# Patient Record
Sex: Male | Born: 1990 | Race: White | Hispanic: No | Marital: Married | Smoking: Never smoker
Health system: Southern US, Community
[De-identification: ages and names within clinical notes are randomized; demographics above are authoritative.]

## PROBLEM LIST (undated history)

## (undated) DIAGNOSIS — T7840XA Allergy, unspecified, initial encounter: Secondary | ICD-10-CM

## (undated) DIAGNOSIS — F988 Other specified behavioral and emotional disorders with onset usually occurring in childhood and adolescence: Secondary | ICD-10-CM

## (undated) DIAGNOSIS — J45909 Unspecified asthma, uncomplicated: Secondary | ICD-10-CM

## (undated) HISTORY — DX: Allergy, unspecified, initial encounter: T78.40XA

## (undated) HISTORY — DX: Unspecified asthma, uncomplicated: J45.909

## (undated) HISTORY — DX: Other specified behavioral and emotional disorders with onset usually occurring in childhood and adolescence: F98.8

---

## 2013-05-23 ENCOUNTER — Encounter (INDEPENDENT_AMBULATORY_CARE_PROVIDER_SITE_OTHER): Payer: Self-pay

## 2013-05-23 ENCOUNTER — Encounter: Payer: Self-pay | Admitting: Adult Health

## 2013-05-23 ENCOUNTER — Ambulatory Visit (INDEPENDENT_AMBULATORY_CARE_PROVIDER_SITE_OTHER): Payer: BC Managed Care – PPO | Admitting: Adult Health

## 2013-05-23 VITALS — BP 102/68 | HR 50 | Temp 98.4°F | Resp 12 | Ht 74.0 in | Wt 179.8 lb

## 2013-05-23 DIAGNOSIS — F988 Other specified behavioral and emotional disorders with onset usually occurring in childhood and adolescence: Secondary | ICD-10-CM

## 2013-05-23 DIAGNOSIS — M25519 Pain in unspecified shoulder: Secondary | ICD-10-CM

## 2013-05-23 DIAGNOSIS — Z0000 Encounter for general adult medical examination without abnormal findings: Secondary | ICD-10-CM

## 2013-05-23 DIAGNOSIS — M25512 Pain in left shoulder: Secondary | ICD-10-CM

## 2013-05-23 MED ORDER — AMPHETAMINE-DEXTROAMPHETAMINE 15 MG PO TABS: 15.0000 mg | ORAL_TABLET | Freq: Every day | ORAL | Status: DC

## 2013-05-23 NOTE — Progress Notes (Signed)
Pre visit review using our clinic review tool, if applicable. No additional management support is needed unless otherwise documented below in the visit note. 

## 2013-05-23 NOTE — Progress Notes (Signed)
Subjective:    Patient ID: Logan Schmidt, male    DOB: 1991/03/11, 23 y.o.   MRN: [id number]  HPI  Logan Schmidt" is a pleasant 23 y/o male who presents to clinic to establish care. Last PCP was [facility]. Will request records. He is feeling well overall. Exercises regularly. Reports that diet could be better. He has a hx of ADD and has been prescribed Adderall. Has not taken this in about 1.5 years. He is working in Audiological scientist and supply chain and feels that he has trouble focusing at times. He would like to restart the Adderall. Karleen Schmidt reports that while working out he felt a strain on his left shoulder. He has full ROM but is having some discomfort in the left shoulder. No radiation of pain. He has not taken any medication for pain.     Past Medical History  Diagnosis Date  . Asthma   . ADD (attention deficit disorder)     Adderall  . Allergy     Cat, dogs     History reviewed. No pertinent past surgical history.   Family History  Problem Relation Age of Onset  . Cancer Mother     breast cancer  . Hyperlipidemia Father   . Hypertension Father   . Asthma Sister   . Heart disease Paternal Grandfather   . Hyperlipidemia Paternal Grandfather      History   Social History  . Marital Status: Single    Spouse Name: N/A    Number of Children: 0  . Years of Education: 16   Occupational History  . Accounting and Supply Chain     [employer]   Social History Main Topics  . Smoking status: Never Smoker   . Smokeless tobacco: Never Used  . Alcohol Use: 3.0 oz/week    5 Cans of beer per week  . Drug Use: No  . Sexual Activity: Not on file   Other Topics Concern  . Not on file   Social History Narrative   Logan "Karleen Schmidt" grew up in [place], [place]. 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 He is living in [place], [place]. He enjoys playing sports and attends the gym. He enjoys spending  time with friends.     Review of Systems  Constitutional: Negative.   HENT: Negative.   Eyes: Negative.   Respiratory: Negative.   Cardiovascular: Negative.   Gastrointestinal: Negative.   Endocrine: Negative.   Genitourinary: Negative.   Musculoskeletal: Negative.   Skin: Negative.   Allergic/Immunologic: Negative.   Neurological: Negative.   Hematological: Negative.   Psychiatric/Behavioral: Negative.        Objective:   Physical Exam  Constitutional: He is oriented to person, place, and time. He appears well-developed and well-nourished. No distress.  HENT:  Head: Normocephalic and atraumatic.  Right Ear: External ear normal.  Left Ear: External ear normal.  Nose: Nose normal.  Mouth/Throat: Oropharynx is clear and moist.  Eyes: Conjunctivae and EOM are normal. Pupils are equal, round, and reactive to light.  Neck: Normal range of motion. Neck supple. No tracheal deviation present. No thyromegaly present.  Cardiovascular: Normal rate, regular rhythm, normal heart sounds and intact distal pulses.  Exam reveals no gallop and no friction rub.   No murmur heard. Pulmonary/Chest: Effort normal and breath sounds normal. No respiratory distress. He has no wheezes. He has no rales.  Abdominal: Soft. Bowel sounds are normal. He exhibits no distension  and no mass. There is no tenderness. There is no rebound and no guarding.  Musculoskeletal: Normal range of motion. He exhibits no edema and no tenderness.  Lymphadenopathy:    He has no cervical adenopathy.  Neurological: He is alert and oriented to person, place, and time. He has normal reflexes. No cranial nerve deficit. Coordination normal.  Skin: Skin is warm and dry.  Psychiatric: He has a normal mood and affect. His behavior is normal. Judgment and thought content normal.       Assessment & Plan:   1. Routine general medical examination at a health care facility Normal physical exam. Last physical exam at Pediatrics  office in [place]. Will request records. Check routine labs. - CBC with Differential; Future - Lipid panel; Future - Basic metabolic panel; Future  2. ADD (attention deficit disorder) Start Adderall 15 mg daily. Prescriptions for 3 months provided.  3. Left shoulder pain Conservative tx with NSAIDs, ice, rest. If no improvement will refer to sports medicine.

## 2015-03-02 ENCOUNTER — Encounter: Payer: Self-pay | Admitting: Nurse Practitioner

## 2015-03-02 ENCOUNTER — Ambulatory Visit (INDEPENDENT_AMBULATORY_CARE_PROVIDER_SITE_OTHER): Payer: BLUE CROSS/BLUE SHIELD | Admitting: Nurse Practitioner

## 2015-03-02 VITALS — BP 112/68 | HR 47 | Temp 98.9°F | Ht 74.0 in | Wt 189.0 lb

## 2015-03-02 DIAGNOSIS — J4520 Mild intermittent asthma, uncomplicated: Secondary | ICD-10-CM | POA: Diagnosis not present

## 2015-03-02 DIAGNOSIS — J45909 Unspecified asthma, uncomplicated: Secondary | ICD-10-CM | POA: Insufficient documentation

## 2015-03-02 DIAGNOSIS — F909 Attention-deficit hyperactivity disorder, unspecified type: Secondary | ICD-10-CM

## 2015-03-02 DIAGNOSIS — F988 Other specified behavioral and emotional disorders with onset usually occurring in childhood and adolescence: Secondary | ICD-10-CM | POA: Insufficient documentation

## 2015-03-02 MED ORDER — AMPHETAMINE-DEXTROAMPHET ER 10 MG PO CP24: 10.0000 mg | ORAL_CAPSULE | Freq: Every day | ORAL | Status: DC

## 2015-03-02 MED ORDER — ALBUTEROL SULFATE HFA 108 (90 BASE) MCG/ACT IN AERS: 1.0000 | INHALATION_SPRAY | Freq: Four times a day (QID) | RESPIRATORY_TRACT | Status: DC | PRN

## 2015-03-02 NOTE — Progress Notes (Signed)
Patient ID: Logan Schmidt, male    DOB: 05/23/1990  Age: 24 y.o. MRN: [id number]  CC: Medication Refill   HPI Logan RiffleMichael Schmidt presents for CC of medication refill.   1) Declined flu shot   Mole under right arm, bumped up and mom concerned  We'll has not grown any in area, but has elevated slightly. Denies color change, discharge, bleeding.  2) Was on Adderall  5th/6th grade was diagnosed  Has it intermittently  Used as needed basis  8 months prior use Used Adderall XR in college   History Logan Schmidt has a past medical history of Asthma; ADD (attention deficit disorder); and Allergy.   He has no past surgical history on file.   His family history includes Asthma in his sister; Cancer in his mother; Heart disease in his paternal grandfather; Hyperlipidemia in his father and paternal grandfather; Hypertension in his father.He reports that he has never smoked. He has never used smokeless tobacco. He reports that he drinks about 3.0 oz of alcohol per week. He reports that he does not use illicit drugs.  Outpatient Prescriptions Prior to Visit  Medication Sig Dispense Refill  . cetirizine (ZYRTEC) 10 MG tablet Take 10 mg by mouth daily.    Marland Kitchen. albuterol (PROVENTIL HFA;VENTOLIN HFA) 108 (90 BASE) MCG/ACT inhaler Inhale 1-2 puffs into the lungs every 6 (six) hours as needed for wheezing or shortness of breath.    . amphetamine-dextroamphetamine (ADDERALL) 15 MG tablet Take 1 tablet (15 mg total) by mouth daily. 30 tablet 0   No facility-administered medications prior to visit.    ROS Review of Systems  Constitutional: Negative for fever, chills, diaphoresis and fatigue.  HENT: Negative for tinnitus and trouble swallowing.   Respiratory: Negative for chest tightness, shortness of breath and wheezing.   Cardiovascular: Negative for chest pain, palpitations and leg swelling.  Gastrointestinal: Negative for nausea, vomiting and diarrhea.  Skin: Positive for color change. Negative for rash.   Neurological: Negative for dizziness, weakness and numbness.  Psychiatric/Behavioral: Positive for confusion. The patient is not nervous/anxious.     Objective:  BP 112/68 mmHg  Pulse 47  Temp(Src) 98.9 F (37.2 C)  Ht 6\' 2"  (1.88 m)  Wt 189 lb (85.73 kg)  BMI 24.26 kg/m2  SpO2 98%  Physical Exam  Constitutional: He is oriented to person, place, and time. He appears well-developed and well-nourished. No distress.  HENT:  Head: Normocephalic and atraumatic.  Right Ear: External ear normal.  Left Ear: External ear normal.  Cardiovascular: Normal rate, regular rhythm and normal heart sounds.  Exam reveals no gallop and no friction rub.   No murmur heard. Pulmonary/Chest: Effort normal and breath sounds normal. No respiratory distress. He has no wheezes. He has no rales. He exhibits no tenderness.  Neurological: He is alert and oriented to person, place, and time.  Skin: Skin is warm and dry. No rash noted. He is not diaphoretic.  Small 2 mm light brown papule No asymmetry, border irregularity, color differences within the nevus  Psychiatric: He has a normal mood and affect. His behavior is normal. Judgment and thought content normal.   Assessment & Plan:   Logan Schmidt was seen today for medication refill.  Diagnoses and all orders for this visit:  Asthma, mild intermittent, uncomplicated  ADD (attention deficit disorder)  Other orders -     albuterol (PROVENTIL HFA;VENTOLIN HFA) 108 (90 BASE) MCG/ACT inhaler; Inhale 1-2 puffs into the lungs every 6 (six) hours as needed for wheezing or shortness of  breath. -     amphetamine-dextroamphetamine (ADDERALL XR) 10 MG 24 hr capsule; Take 1 capsule (10 mg total) by mouth daily.   I have discontinued Logan Schmidt amphetamine-dextroamphetamine. I am also having him start on amphetamine-dextroamphetamine. Additionally, I am having him maintain his cetirizine and albuterol.  Meds ordered this encounter  Medications  . albuterol  (PROVENTIL HFA;VENTOLIN HFA) 108 (90 BASE) MCG/ACT inhaler    Sig: Inhale 1-2 puffs into the lungs every 6 (six) hours as needed for wheezing or shortness of breath.    Dispense:  1 Inhaler    Refill:  2    Order Specific Question:  Supervising Provider    Answer:  Darrick Huntsman, TERESA L [[id number]]  . amphetamine-dextroamphetamine (ADDERALL XR) 10 MG 24 hr capsule    Sig: Take 1 capsule (10 mg total) by mouth daily.    Dispense:  14 capsule    Refill:  0    Order Specific Question:  Supervising Provider    Answer:  Sherlene Shams [[id number]]     Follow-up: Return in about 2 weeks (around 03/16/2015) for Medication follow up.

## 2015-03-02 NOTE — Assessment & Plan Note (Addendum)
No recent concerns. Lungs sounds good today. Albuterol inhaler refilled due to expiration of last inhaler

## 2015-03-02 NOTE — Patient Instructions (Signed)
Nice to meet you!   Follow up in 2 weeks about your ADD- we will see if you need a medication change.

## 2015-03-02 NOTE — Assessment & Plan Note (Addendum)
Diagnosed as a 5th/6th grader approximately. Pt has been intermittently on medication to help with inattentiveness. He has a diagnosis, but is unsure of where this paperwork is since his parent's recently moved. He was given a medical release form to get records to us. CSC signed today. UDS will obtain at next visit. Willing to try Adderall XR 10 mg daily. Gave 2 weeks worth. Will FU in 2 weeks.

## 2015-03-18 ENCOUNTER — Ambulatory Visit: Payer: BLUE CROSS/BLUE SHIELD | Admitting: Nurse Practitioner

## 2015-03-24 ENCOUNTER — Encounter: Payer: Self-pay | Admitting: Nurse Practitioner

## 2015-03-24 ENCOUNTER — Ambulatory Visit: Payer: BLUE CROSS/BLUE SHIELD | Admitting: Nurse Practitioner

## 2015-03-24 ENCOUNTER — Ambulatory Visit (INDEPENDENT_AMBULATORY_CARE_PROVIDER_SITE_OTHER): Payer: BLUE CROSS/BLUE SHIELD | Admitting: Nurse Practitioner

## 2015-03-24 VITALS — BP 116/72 | HR 57 | Temp 98.2°F | Resp 14 | Ht 74.0 in | Wt 191.2 lb

## 2015-03-24 DIAGNOSIS — I781 Nevus, non-neoplastic: Secondary | ICD-10-CM

## 2015-03-24 DIAGNOSIS — F988 Other specified behavioral and emotional disorders with onset usually occurring in childhood and adolescence: Secondary | ICD-10-CM

## 2015-03-24 DIAGNOSIS — F909 Attention-deficit hyperactivity disorder, unspecified type: Secondary | ICD-10-CM

## 2015-03-24 MED ORDER — AMPHETAMINE-DEXTROAMPHET ER 10 MG PO CP24: 10.0000 mg | ORAL_CAPSULE | Freq: Every day | ORAL | Status: DC

## 2015-03-24 NOTE — Progress Notes (Signed)
Patient ID: Logan Schmidt, male    DOB: 07/26/1990  Age: 25 y.o. MRN: [id number]  CC: Medication Refill and Nevus   HPI Logan Schmidt presents for follow up of ADHD medication and nevus.   1) Pt reports he showed me the wrong nevus at the last visit. The one he is concerned about is under his left arm.  2 mm, light brown- fleck of dark brown in the center and raised  No changes recently, denies bleeding, discharge, or growth   2) Lowered appetite slightly, staying on task and feels he wants to stay on the same dosage.   History Logan Schmidt has a past medical history of Asthma; ADD (attention deficit disorder); and Allergy.   He has no past surgical history on file.   His family history includes Asthma in his sister; Cancer in his mother; Heart disease in his paternal grandfather; Hyperlipidemia in his father and paternal grandfather; Hypertension in his father.He reports that he has never smoked. He has never used smokeless tobacco. He reports that he drinks about 3.0 oz of alcohol per week. He reports that he does not use illicit drugs.  Outpatient Prescriptions Prior to Visit  Medication Sig Dispense Refill  . albuterol (PROVENTIL HFA;VENTOLIN HFA) 108 (90 BASE) MCG/ACT inhaler Inhale 1-2 puffs into the lungs every 6 (six) hours as needed for wheezing or shortness of breath. 1 Inhaler 2  . cetirizine (ZYRTEC) 10 MG tablet Take 10 mg by mouth daily.    Marland Kitchen. amphetamine-dextroamphetamine (ADDERALL XR) 10 MG 24 hr capsule Take 1 capsule (10 mg total) by mouth daily. 14 capsule 0   No facility-administered medications prior to visit.    ROS Review of Systems  Constitutional: Negative for fever, chills, diaphoresis and fatigue.  Cardiovascular: Negative for chest pain, palpitations and leg swelling.  Skin: Negative for color change, pallor, rash and wound.  Psychiatric/Behavioral: Positive for decreased concentration. Negative for suicidal ideas and sleep disturbance. The patient is not  nervous/anxious and is not hyperactive.     Objective:  BP 116/72 mmHg  Pulse 57  Temp(Src) 98.2 F (36.8 C) (Oral)  Resp 14  Ht 6\' 2"  (1.88 m)  Wt 191 lb 3.2 oz (86.728 kg)  BMI 24.54 kg/m2  SpO2 97%  Physical Exam  Constitutional: He is oriented to person, place, and time. He appears well-developed and well-nourished. No distress.  HENT:  Head: Normocephalic and atraumatic.  Right Ear: External ear normal.  Left Ear: External ear normal.  Cardiovascular: Normal rate, regular rhythm and normal heart sounds.  Exam reveals no gallop and no friction rub.   No murmur heard. Athletic   Pulmonary/Chest: Effort normal and breath sounds normal. No respiratory distress. He has no wheezes. He has no rales. He exhibits no tenderness.  Neurological: He is alert and oriented to person, place, and time.  Skin: Skin is warm and dry. No rash noted. He is not diaphoretic.  2 mm raised, symmetric, non-irregular border, light brown with 1 fleck of dark brown in center, no sign of irritation Left axilla  Psychiatric: He has a normal mood and affect. His behavior is normal. Judgment and thought content normal.   Assessment & Plan:   Logan Schmidt was seen today for medication refill and nevus.  Diagnoses and all orders for this visit:  Nevus, non-neoplastic  ADD (attention deficit disorder)  Other orders -     Discontinue: amphetamine-dextroamphetamine (ADDERALL XR) 10 MG 24 hr capsule; Take 1 capsule (10 mg total) by mouth daily. -  Discontinue: amphetamine-dextroamphetamine (ADDERALL XR) 10 MG 24 hr capsule; Take 1 capsule (10 mg total) by mouth daily. -     amphetamine-dextroamphetamine (ADDERALL XR) 10 MG 24 hr capsule; Take 1 capsule (10 mg total) by mouth daily.  I am having Mr. Druck maintain his cetirizine, albuterol, and amphetamine-dextroamphetamine.  Meds ordered this encounter  Medications  . DISCONTD: amphetamine-dextroamphetamine (ADDERALL XR) 10 MG 24 hr capsule    Sig: Take  1 capsule (10 mg total) by mouth daily.    Dispense:  30 capsule    Refill:  0    Fill on or after Jan. 10th, 2017    Order Specific Question:  Supervising Provider    Answer:  Duncan Dull L [[id number]]  . DISCONTD: amphetamine-dextroamphetamine (ADDERALL XR) 10 MG 24 hr capsule    Sig: Take 1 capsule (10 mg total) by mouth daily.    Dispense:  30 capsule    Refill:  0    Fill on or after Feb. 10th, 2017    Order Specific Question:  Supervising Provider    Answer:  Duncan Dull L [[id number]]  . amphetamine-dextroamphetamine (ADDERALL XR) 10 MG 24 hr capsule    Sig: Take 1 capsule (10 mg total) by mouth daily.    Dispense:  30 capsule    Refill:  0    Fill on or after March 10th, 2017    Order Specific Question:  Supervising Provider    Answer:  Sherlene Shams [[id number]]     Follow-up: Return in about 3 months (around 06/22/2015) for Follow up for ADHD.

## 2015-03-24 NOTE — Patient Instructions (Signed)
Please follow up in 3 months.  

## 2015-03-29 DIAGNOSIS — I781 Nevus, non-neoplastic: Secondary | ICD-10-CM | POA: Insufficient documentation

## 2015-03-29 NOTE — Assessment & Plan Note (Signed)
New onset Stable Will follow  Asked him to let me know if anything changes

## 2015-03-29 NOTE — Assessment & Plan Note (Signed)
Records received and sent to scan  Established problem stable CSC signed last visit- no UDS today due to inability to urinate- low suspicion for deviance. [place] checked for compliance  3 months of scripts printed, signed, and given to pt to take to pharmacy. FU in 3 months

## 2015-06-25 ENCOUNTER — Ambulatory Visit: Payer: BLUE CROSS/BLUE SHIELD | Admitting: Nurse Practitioner

## 2015-10-21 ENCOUNTER — Encounter: Payer: Self-pay | Admitting: Family

## 2015-10-21 ENCOUNTER — Ambulatory Visit (INDEPENDENT_AMBULATORY_CARE_PROVIDER_SITE_OTHER): Payer: BLUE CROSS/BLUE SHIELD | Admitting: Family

## 2015-10-21 ENCOUNTER — Encounter (INDEPENDENT_AMBULATORY_CARE_PROVIDER_SITE_OTHER): Payer: Self-pay

## 2015-10-21 VITALS — BP 114/68 | HR 47 | Temp 98.0°F | Ht 74.0 in | Wt 192.0 lb

## 2015-10-21 DIAGNOSIS — Z0000 Encounter for general adult medical examination without abnormal findings: Secondary | ICD-10-CM | POA: Diagnosis not present

## 2015-10-21 DIAGNOSIS — J4520 Mild intermittent asthma, uncomplicated: Secondary | ICD-10-CM

## 2015-10-21 DIAGNOSIS — R6889 Other general symptoms and signs: Secondary | ICD-10-CM

## 2015-10-21 DIAGNOSIS — F909 Attention-deficit hyperactivity disorder, unspecified type: Secondary | ICD-10-CM | POA: Diagnosis not present

## 2015-10-21 DIAGNOSIS — Z0001 Encounter for general adult medical examination with abnormal findings: Secondary | ICD-10-CM | POA: Diagnosis not present

## 2015-10-21 DIAGNOSIS — F988 Other specified behavioral and emotional disorders with onset usually occurring in childhood and adolescence: Secondary | ICD-10-CM

## 2015-10-21 LAB — VITAMIN D 25 HYDROXY (VIT D DEFICIENCY, FRACTURES): VITD: 15.69 ng/mL — ABNORMAL LOW (ref 30.00–100.00)

## 2015-10-21 LAB — CBC WITH DIFFERENTIAL/PLATELET
BASOS PCT: 0.5 % (ref 0.0–3.0)
Basophils Absolute: 0 10*3/uL (ref 0.0–0.1)
EOS ABS: 0.4 10*3/uL (ref 0.0–0.7)
Eosinophils Relative: 8.6 % — ABNORMAL HIGH (ref 0.0–5.0)
HCT: 42.5 % (ref 39.0–52.0)
Hemoglobin: 14.4 g/dL (ref 13.0–17.0)
Lymphocytes Relative: 31.2 % (ref 12.0–46.0)
Lymphs Abs: 1.6 10*3/uL (ref 0.7–4.0)
MCHC: 33.7 g/dL (ref 30.0–36.0)
MCV: 89.5 fl (ref 78.0–100.0)
MONO ABS: 0.4 10*3/uL (ref 0.1–1.0)
Monocytes Relative: 7.5 % (ref 3.0–12.0)
NEUTROS ABS: 2.7 10*3/uL (ref 1.4–7.7)
Neutrophils Relative %: 52.2 % (ref 43.0–77.0)
PLATELETS: 133 10*3/uL — AB (ref 150.0–400.0)
RBC: 4.76 Mil/uL (ref 4.22–5.81)
RDW: 13.5 % (ref 11.5–15.5)
WBC: 5.2 10*3/uL (ref 4.0–10.5)

## 2015-10-21 LAB — LIPID PANEL
CHOLESTEROL: 119 mg/dL (ref 0–200)
HDL: 56.8 mg/dL (ref >39.00)
LDL CALC: 51 mg/dL (ref 0–99)
NonHDL: 62.48
Total CHOL/HDL Ratio: 2
Triglycerides: 58 mg/dL (ref 0.0–149.0)
VLDL: 11.6 mg/dL (ref 0.0–40.0)

## 2015-10-21 LAB — COMPREHENSIVE METABOLIC PANEL
ALT: 14 U/L (ref 0–53)
AST: 23 U/L (ref 0–37)
Albumin: 4.4 g/dL (ref 3.5–5.2)
Alkaline Phosphatase: 56 U/L (ref 39–117)
BUN: 18 mg/dL (ref 6–23)
CALCIUM: 9.3 mg/dL (ref 8.4–10.5)
CHLORIDE: 104 meq/L (ref 96–112)
CO2: 31 meq/L (ref 19–32)
CREATININE: 1.01 mg/dL (ref 0.40–1.50)
GFR: 94.41 mL/min (ref >60.00)
Glucose, Bld: 87 mg/dL (ref 70–99)
POTASSIUM: 3.8 meq/L (ref 3.5–5.1)
SODIUM: 138 meq/L (ref 135–145)
Total Bilirubin: 0.7 mg/dL (ref 0.2–1.2)
Total Protein: 8 g/dL (ref 6.0–8.3)

## 2015-10-21 LAB — TSH: TSH: 1.32 u[IU]/mL (ref 0.35–4.50)

## 2015-10-21 LAB — HEMOGLOBIN A1C: Hgb A1c MFr Bld: 5.4 % (ref 4.6–6.5)

## 2015-10-21 MED ORDER — ALBUTEROL SULFATE HFA 108 (90 BASE) MCG/ACT IN AERS: 1.0000 | INHALATION_SPRAY | Freq: Four times a day (QID) | RESPIRATORY_TRACT | 2 refills | Status: DC | PRN

## 2015-10-21 MED ORDER — AMPHETAMINE-DEXTROAMPHET ER 10 MG PO CP24: 10.0000 mg | ORAL_CAPSULE | Freq: Every day | ORAL | 0 refills | Status: DC

## 2015-10-21 NOTE — Patient Instructions (Addendum)
Pleasure meeting you.  Health Maintenance, Male A healthy lifestyle and preventative care can promote health and wellness.  Maintain regular health, dental, and eye exams.  Eat a healthy diet. Foods like vegetables, fruits, whole grains, low-fat dairy products, and lean protein foods contain the nutrients you need and are low in calories. Decrease your intake of foods high in solid fats, added sugars, and salt. Get information about a proper diet from your health care provider, if necessary.  Regular physical exercise is one of the most important things you can do for your health. Most adults should get at least 150 minutes of moderate-intensity exercise (any activity that increases your heart rate and causes you to sweat) each week. In addition, most adults need muscle-strengthening exercises on 2 or more days a week.   Maintain a healthy weight. The body mass index (BMI) is a screening tool to identify possible weight problems. It provides an estimate of body fat based on height and weight. Your health care provider can find your BMI and can help you achieve or maintain a healthy weight. For males 20 years and older:  A BMI below 18.5 is considered underweight.  A BMI of 18.5 to 24.9 is normal.  A BMI of 25 to 29.9 is considered overweight.  A BMI of 30 and above is considered obese.  Maintain normal blood lipids and cholesterol by exercising and minimizing your intake of saturated fat. Eat a balanced diet with plenty of fruits and vegetables. Blood tests for lipids and cholesterol should begin at age 75 and be repeated every 5 years. If your lipid or cholesterol levels are high, you are over age 29, or you are at high risk for heart disease, you may need your cholesterol levels checked more frequently.Ongoing high lipid and cholesterol levels should be treated with medicines if diet and exercise are not working.  If you smoke, find out from your health care provider how to quit. If you do  not use tobacco, do not start.  Lung cancer screening is recommended for adults aged 4-80 years who are at high risk for developing lung cancer because of a history of smoking. A yearly low-dose CT scan of the lungs is recommended for people who have at least a 30-pack-year history of smoking and are current smokers or have quit within the past 15 years. A pack year of smoking is smoking an average of 1 pack of cigarettes a day for 1 year (for example, a 30-pack-year history of smoking could mean smoking 1 pack a day for 30 years or 2 packs a day for 15 years). Yearly screening should continue until the smoker has stopped smoking for at least 15 years. Yearly screening should be stopped for people who develop a health problem that would prevent them from having lung cancer treatment.  If you choose to drink alcohol, do not have more than 2 drinks per day. One drink is considered to be 12 oz (360 mL) of beer, 5 oz (150 mL) of wine, or 1.5 oz (45 mL) of liquor.  Avoid the use of street drugs. Do not share needles with anyone. Ask for help if you need support or instructions about stopping the use of drugs.  High blood pressure causes heart disease and increases the risk of stroke. High blood pressure is more likely to develop in:  People who have blood pressure in the end of the normal range (100-139/85-89 mm Hg).  People who are overweight or obese.  People who  are African American.  If you are 6718-27 years of age, have your blood pressure checked every 3-5 years. If you are 27 years of age or older, have your blood pressure checked every year. You should have your blood pressure measured twice--once when you are at a hospital or clinic, and once when you are not at a hospital or clinic. Record the average of the two measurements. To check your blood pressure when you are not at a hospital or clinic, you can use:  An automated blood pressure machine at a pharmacy.  A home blood pressure  monitor.  If you are 145-27 years old, ask your health care provider if you should take aspirin to prevent heart disease.  Diabetes screening involves taking a blood sample to check your fasting blood sugar level. This should be done once every 3 years after age 27 if you are at a normal weight and without risk factors for diabetes. Testing should be considered at a younger age or be carried out more frequently if you are overweight and have at least 1 risk factor for diabetes.  Colorectal cancer can be detected and often prevented. Most routine colorectal cancer screening begins at the age of 27 and continues through age 27. However, your health care provider may recommend screening at an earlier age if you have risk factors for colon cancer. On a yearly basis, your health care provider may provide home test kits to check for hidden blood in the stool. A small camera at the end of a tube may be used to directly examine the colon (sigmoidoscopy or colonoscopy) to detect the earliest forms of colorectal cancer. Talk to your health care provider about this at age 27 when routine screening begins. A direct exam of the colon should be repeated every 5-10 years through age 27, unless early forms of precancerous polyps or small growths are found.  People who are at an increased risk for hepatitis B should be screened for this virus. You are considered at high risk for hepatitis B if:  You were born in a country where hepatitis B occurs often. Talk with your health care provider about which countries are considered high risk.  Your parents were born in a high-risk country and you have not received a shot to protect against hepatitis B (hepatitis B vaccine).  You have HIV or AIDS.  You use needles to inject street drugs.  You live with, or have sex with, someone who has hepatitis B.  You are a man who has sex with other men (MSM).  You get hemodialysis treatment.  You take certain medicines for  conditions like cancer, organ transplantation, and autoimmune conditions.  Hepatitis C blood testing is recommended for all people born from [id number] through 1965 and any individual with known risk factors for hepatitis C.  Healthy men should no longer receive prostate-specific antigen (PSA) blood tests as part of routine cancer screening. Talk to your health care provider about prostate cancer screening.  Testicular cancer screening is not recommended for adolescents or adult males who have no symptoms. Screening includes self-exam, a health care provider exam, and other screening tests. Consult with your health care provider about any symptoms you have or any concerns you have about testicular cancer.  Practice safe sex. Use condoms and avoid high-risk sexual practices to reduce the spread of sexually transmitted infections (STIs).  You should be screened for STIs, including gonorrhea and chlamydia if:  You are sexually active and are younger than  24 years.  You are older than 24 years, and your health care provider tells you that you are at risk for this type of infection.  Your sexual activity has changed since you were last screened, and you are at an increased risk for chlamydia or gonorrhea. Ask your health care provider if you are at risk.  If you are at risk of being infected with HIV, it is recommended that you take a prescription medicine daily to prevent HIV infection. This is called pre-exposure prophylaxis (PrEP). You are considered at risk if:  You are a man who has sex with other men (MSM).  You are a heterosexual man who is sexually active with multiple partners.  You take drugs by injection.  You are sexually active with a partner who has HIV.  Talk with your health care provider about whether you are at high risk of being infected with HIV. If you choose to begin PrEP, you should first be tested for HIV. You should then be tested every 3 months for as long as you are taking  PrEP.  Use sunscreen. Apply sunscreen liberally and repeatedly throughout the day. You should seek shade when your shadow is shorter than you. Protect yourself by wearing long sleeves, pants, a wide-brimmed hat, and sunglasses year round whenever you are outdoors.  Tell your health care provider of new moles or changes in moles, especially if there is a change in shape or color. Also, tell your health care provider if a mole is larger than the size of a pencil eraser.  A one-time screening for abdominal aortic aneurysm (AAA) and surgical repair of large AAAs by ultrasound is recommended for men aged 65-75 years who are current or former smokers.  Stay current with your vaccines (immunizations).   This information is not intended to replace advice given to you by your health care provider. Make sure you discuss any questions you have with your health care provider.   Document Released: 08/27/2007 Document Revised: 03/21/2014 Document Reviewed: 07/26/2010 Elsevier Interactive Patient Education [employer]

## 2015-10-21 NOTE — Progress Notes (Signed)
Subjective:    Patient ID: Logan RiffleMichael Steckman, male    DOB: 04/18/1990, 25 y.o.   MRN: [id number]  CC: Logan RiffleMichael Thoma is a 25 y.o. male who presents today for follow up.   HPI: Patient presents to follow up, for his ADD and refill medication. New CSC to be signed today.   ADD- Diagnosed with formal testing as a child; started Adderall as child however didn't use medication until college at [employer]. Has found that quality of work improves on Adderall. No side effects on medication - sleeping and eating well. No palpitations.  Asthma - well controlled; no recent episodes however inhaler expired.        HISTORY:  Past Medical History:  Diagnosis Date  . ADD (attention deficit disorder)    Adderall  . Allergy    Cat, dogs  . Asthma    History reviewed. No pertinent surgical history. Family History  Problem Relation Age of Onset  . Cancer Mother     breast cancer  . Hyperlipidemia Father   . Hypertension Father   . Asthma Sister   . Heart disease Paternal Grandfather   . Hyperlipidemia Paternal Grandfather     Allergies: Penicillins Current Outpatient Prescriptions on File Prior to Visit  Medication Sig Dispense Refill  . albuterol (PROVENTIL HFA;VENTOLIN HFA) 108 (90 BASE) MCG/ACT inhaler Inhale 1-2 puffs into the lungs every 6 (six) hours as needed for wheezing or shortness of breath. 1 Inhaler 2   No current facility-administered medications on file prior to visit.     Social History  Substance Use Topics  . Smoking status: Never Smoker  . Smokeless tobacco: Never Used  . Alcohol use 6.0 oz/week    5 Cans of beer, 5 Shots of liquor per week    Review of Systems  Constitutional: Negative for chills and fever.  HENT: Negative for congestion.   Respiratory: Negative for cough.   Cardiovascular: Negative for chest pain, palpitations and leg swelling.  Gastrointestinal: Negative for diarrhea, nausea and vomiting.  Musculoskeletal: Negative for myalgias.  Skin: Negative  for rash.  Neurological: Negative for headaches.  Hematological: Negative for adenopathy.  Psychiatric/Behavioral: Negative for confusion.      Objective:    BP 114/68 (BP Location: Left Arm, Patient Position: Sitting, Cuff Size: Large)   Pulse (!) 47   Temp 98 F (36.7 C) (Oral)   Ht 6\' 2"  (1.88 m)   Wt 192 lb (87.1 kg)   SpO2 98%   BMI 24.65 kg/m  BP Readings from Last 3 Encounters:  10/21/15 114/68  03/24/15 116/72  03/02/15 112/68   Wt Readings from Last 3 Encounters:  10/21/15 192 lb (87.1 kg)  03/24/15 191 lb 3.2 oz (86.7 kg)  03/02/15 189 lb (85.7 kg)    Physical Exam  Constitutional: He appears well-developed and well-nourished.  Cardiovascular: Regular rhythm and normal heart sounds.   Pulmonary/Chest: Effort normal and breath sounds normal. No respiratory distress. He has no wheezes. He has no rhonchi. He has no rales.  Neurological: He is alert.  Skin: Skin is warm and dry.  Psychiatric: He has a normal mood and affect. His speech is normal and behavior is normal.  Vitals reviewed.      Assessment & Plan:   Problem List Items Addressed This Visit      Respiratory   Asthma    Stable. Inhaler refilled.         Other   ADD (attention deficit disorder)    Stable. Well-controlled  on current regimen. Reviewed chart and it appears patients requested medical records from [facility] pediatrics on ADD however we never received these. Requested again. Patient states he was formally evaluated as a child. Assessed [place] Controlled Substance Reporting System and did not see suspicious activity at this time under patient's name and address in Epic. New CSC signed. Printed 3 month supply for patient.        Relevant Medications   amphetamine-dextroamphetamine (ADDERALL XR) 10 MG 24 hr capsule   Encounter for routine adult physical exam with abnormal findings - Primary    Patient politely declined testicular exam and STD testing, HIV  today. Up-to-date on  immunizations. We'll do screening labs today. Encouraged safe sex, exercise.        Relevant Medications   amphetamine-dextroamphetamine (ADDERALL XR) 10 MG 24 hr capsule    Other Visit Diagnoses   None.      I have discontinued Mr. Hartnett cetirizine. I am also having him maintain his albuterol, amphetamine-dextroamphetamine, amphetamine-dextroamphetamine, and amphetamine-dextroamphetamine.   Meds ordered this encounter  Medications  . amphetamine-dextroamphetamine (ADDERALL XR) 10 MG 24 hr capsule    Sig: Take 1 capsule (10 mg total) by mouth daily.    Dispense:  30 capsule    Refill:  0    Order Specific Question:   Supervising Provider    Answer:   Duncan Dull L [[id number]]  . amphetamine-dextroamphetamine (ADDERALL XR) 10 MG 24 hr capsule    Sig: Take 1 capsule (10 mg total) by mouth daily.    Dispense:  30 capsule    Refill:  0    Fill on or after 11/21/2015.    Order Specific Question:   Supervising Provider    Answer:   Sherlene Shams [[id number]]  . amphetamine-dextroamphetamine (ADDERALL XR) 10 MG 24 hr capsule    Sig: Take 1 capsule (10 mg total) by mouth daily.    Dispense:  30 capsule    Refill:  0    Fill on or after 12/21/2015    Order Specific Question:   Supervising Provider    Answer:   Sherlene Shams [[id number]]    Return precautions given.   Risks, benefits, and alternatives of the medications and treatment plan prescribed today were discussed, and patient expressed understanding.   Education regarding symptom management and diagnosis given to patient on AVS.  Continue to follow with Rennie Plowman, FNP for routine health maintenance.   Logan Schmidt and I agreed with plan.   Rennie Plowman, FNP  Total of 25 minutes spent with patient, greater than 50% of which was spent in discussion of  ADD, CSC.

## 2015-10-21 NOTE — Assessment & Plan Note (Addendum)
Stable. Well-controlled on current regimen. Reviewed chart and it appears patients [facility] however we never received these. Requested again. Patient states he was formally evaluated as a child. Assessed [place] Controlled Substance Reporting System and did not see suspicious activity at this time under patient's name and address in Epic. New CSC signed. Printed 3 month supply for patient.

## 2015-10-21 NOTE — Progress Notes (Signed)
Pre visit review using our clinic review tool, if applicable. No additional management support is needed unless otherwise documented below in the visit note. 

## 2015-10-21 NOTE — Assessment & Plan Note (Signed)
Stable. Inhaler refilled.

## 2015-10-21 NOTE — Assessment & Plan Note (Addendum)
Patient politely declined testicular exam and STD testing, HIV  today. Up-to-date on immunizations. We'll do screening labs today. Encouraged safe sex, exercise.

## 2015-10-22 ENCOUNTER — Encounter: Payer: Self-pay | Admitting: Family

## 2015-10-22 DIAGNOSIS — E559 Vitamin D deficiency, unspecified: Secondary | ICD-10-CM | POA: Insufficient documentation

## 2015-12-14 ENCOUNTER — Encounter (INDEPENDENT_AMBULATORY_CARE_PROVIDER_SITE_OTHER): Payer: Self-pay

## 2015-12-14 ENCOUNTER — Encounter: Payer: Self-pay | Admitting: Family

## 2015-12-14 ENCOUNTER — Ambulatory Visit (INDEPENDENT_AMBULATORY_CARE_PROVIDER_SITE_OTHER): Payer: BLUE CROSS/BLUE SHIELD | Admitting: Family

## 2015-12-14 DIAGNOSIS — M25512 Pain in left shoulder: Secondary | ICD-10-CM | POA: Diagnosis not present

## 2015-12-14 DIAGNOSIS — M25511 Pain in right shoulder: Secondary | ICD-10-CM | POA: Insufficient documentation

## 2015-12-14 NOTE — Patient Instructions (Signed)
Rest Alternating heat and ice Ibuprofen or capsaicin gel over the counter Gentle stretching  If there is no improvement in your symptoms, or if there is any worsening of symptoms, or if you have any additional concerns, please return for re-evaluation; or, if we are closed, consider going to the Emergency Room for evaluation if symptoms urgent.   Impingement Syndrome, Rotator Cuff, Bursitis With Rehab Impingement syndrome is a condition that involves inflammation of the tendons of the rotator cuff and the subacromial bursa, that causes pain in the shoulder. The rotator cuff consists of four tendons and muscles that control much of the shoulder and upper arm function. The subacromial bursa is a fluid filled sac that helps reduce friction between the rotator cuff and one of the bones of the shoulder (acromion). Impingement syndrome is usually an overuse injury that causes swelling of the bursa (bursitis), swelling of the tendon (tendonitis), and/or a tear of the tendon (strain). Strains are classified into three categories. Grade 1 strains cause pain, but the tendon is not lengthened. Grade 2 strains include a lengthened ligament, due to the ligament being stretched or partially ruptured. With grade 2 strains there is still function, although the function may be decreased. Grade 3 strains include a complete tear of the tendon or muscle, and function is usually impaired. SYMPTOMS   Pain around the shoulder, often at the outer portion of the upper arm.  Pain that gets worse with shoulder function, especially when reaching overhead or lifting.  Sometimes, aching when not using the arm.  Pain that wakes you up at night.  Sometimes, tenderness, swelling, warmth, or redness over the affected area.  Loss of strength.  Limited motion of the shoulder, especially reaching behind the back (to the back pocket or to unhook bra) or across your body.  Crackling sound (crepitation) when moving the  arm.  Biceps tendon pain and inflammation (in the front of the shoulder). Worse when bending the elbow or lifting. CAUSES  Impingement syndrome is often an overuse injury, in which chronic (repetitive) motions cause the tendons or bursa to become inflamed. A strain occurs when a force is paced on the tendon or muscle that is greater than it can withstand. Common mechanisms of injury include: Stress from sudden increase in duration, frequency, or intensity of training.  Direct hit (trauma) to the shoulder.  Aging, erosion of the tendon with normal use.  Bony bump on shoulder (acromial spur). RISK INCREASES WITH:  Contact sports (football, wrestling, boxing).  Throwing sports (baseball, tennis, volleyball).  Weightlifting and bodybuilding.  Heavy labor.  Previous injury to the rotator cuff, including impingement.  Poor shoulder strength and flexibility.  Failure to warm up properly before activity.  Inadequate protective equipment.  Old age.  Bony bump on shoulder (acromial spur). PREVENTION   Warm up and stretch properly before activity.  Allow for adequate recovery between workouts.  Maintain physical fitness:  Strength, flexibility, and endurance.  Cardiovascular fitness.  Learn and use proper exercise technique. PROGNOSIS  If treated properly, impingement syndrome usually goes away within 6 weeks. Sometimes surgery is required.  RELATED COMPLICATIONS   Longer healing time if not properly treated, or if not given enough time to heal.  Recurring symptoms, that result in a chronic condition.  Shoulder stiffness, frozen shoulder, or loss of motion.  Rotator cuff tendon tear.  Recurring symptoms, especially if activity is resumed too soon, with overuse, with a direct blow, or when using poor technique. TREATMENT  Treatment first involves  the use of ice and medicine, to reduce pain and inflammation. The use of strengthening and stretching exercises may help  reduce pain with activity. These exercises may be performed at home or with a therapist. If non-surgical treatment is unsuccessful after more than 6 months, surgery may be advised. After surgery and rehabilitation, activity is usually possible in 3 months.  MEDICATION  If pain medicine is needed, nonsteroidal anti-inflammatory medicines (aspirin and ibuprofen), or other minor pain relievers (acetaminophen), are often advised.  Do not take pain medicine for 7 days before surgery.  Prescription pain relievers may be given, if your caregiver thinks they are needed. Use only as directed and only as much as you need.  Corticosteroid injections may be given by your caregiver. These injections should be reserved for the most serious cases, because they may only be given a certain number of times. HEAT AND COLD  Cold treatment (icing) should be applied for 10 to 15 minutes every 2 to 3 hours for inflammation and pain, and immediately after activity that aggravates your symptoms. Use ice packs or an ice massage.  Heat treatment may be used before performing stretching and strengthening activities prescribed by your caregiver, physical therapist, or athletic trainer. Use a heat pack or a warm water soak. SEEK MEDICAL CARE IF:   Symptoms get worse or do not improve in 4 to 6 weeks, despite treatment.  New, unexplained symptoms develop. (Drugs used in treatment may produce side effects.) EXERCISES  RANGE OF MOTION (ROM) AND STRETCHING EXERCISES - Impingement Syndrome (Rotator Cuff  Tendinitis, Bursitis) These exercises may help you when beginning to rehabilitate your injury. Your symptoms may go away with or without further involvement from your physician, physical therapist or athletic trainer. While completing these exercises, remember:   Restoring tissue flexibility helps normal motion to return to the joints. This allows healthier, less painful movement and activity.  An effective stretch should  be held for at least 30 seconds.  A stretch should never be painful. You should only feel a gentle lengthening or release in the stretched tissue. STRETCH - Flexion, Standing  Stand with good posture. With an underhand grip on your right / left hand, and an overhand grip on the opposite hand, grasp a broomstick or cane so that your hands are a little more than shoulder width apart.  Keeping your right / left elbow straight and shoulder muscles relaxed, push the stick with your opposite hand, to raise your right / left arm in front of your body and then overhead. Raise your arm until you feel a stretch in your right / left shoulder, but before you have increased shoulder pain.  Try to avoid shrugging your right / left shoulder as your arm rises, by keeping your shoulder blade tucked down and toward your mid-back spine. Hold for __________ seconds.  Slowly return to the starting position. Repeat __________ times. Complete this exercise __________ times per day. STRETCH - Abduction, Supine  Lie on your back. With an underhand grip on your right / left hand and an overhand grip on the opposite hand, grasp a broomstick or cane so that your hands are a little more than shoulder width apart.  Keeping your right / left elbow straight and your shoulder muscles relaxed, push the stick with your opposite hand, to raise your right / left arm out to the side of your body and then overhead. Raise your arm until you feel a stretch in your right / left shoulder, but before  you have increased shoulder pain.  Try to avoid shrugging your right / left shoulder as your arm rises, by keeping your shoulder blade tucked down and toward your mid-back spine. Hold for __________ seconds.  Slowly return to the starting position. Repeat __________ times. Complete this exercise __________ times per day. ROM - Flexion, Active-Assisted  Lie on your back. You may bend your knees for comfort.  Grasp a broomstick or cane so  your hands are about shoulder width apart. Your right / left hand should grip the end of the stick, so that your hand is positioned "thumbs-up," as if you were about to shake hands.  Using your healthy arm to lead, raise your right / left arm overhead, until you feel a gentle stretch in your shoulder. Hold for __________ seconds.  Use the stick to assist in returning your right / left arm to its starting position. Repeat __________ times. Complete this exercise __________ times per day.  ROM - Internal Rotation, Supine   Lie on your back on a firm surface. Place your right / left elbow about 60 degrees away from your side. Elevate your elbow with a folded towel, so that the elbow and shoulder are the same height.  Using a broomstick or cane and your strong arm, pull your right / left hand toward your body until you feel a gentle stretch, but no increase in your shoulder pain. Keep your shoulder and elbow in place throughout the exercise.  Hold for __________ seconds. Slowly return to the starting position. Repeat __________ times. Complete this exercise __________ times per day. STRETCH - Internal Rotation  Place your right / left hand behind your back, palm up.  Throw a towel or belt over your opposite shoulder. Grasp the towel with your right / left hand.  While keeping an upright posture, gently pull up on the towel, until you feel a stretch in the front of your right / left shoulder.  Avoid shrugging your right / left shoulder as your arm rises, by keeping your shoulder blade tucked down and toward your mid-back spine.  Hold for __________ seconds. Release the stretch, by lowering your healthy hand. Repeat __________ times. Complete this exercise __________ times per day. ROM - Internal Rotation   Using an underhand grip, grasp a stick behind your back with both hands.  While standing upright with good posture, slide the stick up your back until you feel a mild stretch in the front  of your shoulder.  Hold for __________ seconds. Slowly return to your starting position. Repeat __________ times. Complete this exercise __________ times per day.  STRETCH - Posterior Shoulder Capsule   Stand or sit with good posture. Grasp your right / left elbow and draw it across your chest, keeping it at the same height as your shoulder.  Pull your elbow, so your upper arm comes in closer to your chest. Pull until you feel a gentle stretch in the back of your shoulder.  Hold for __________ seconds. Repeat __________ times. Complete this exercise __________ times per day. STRENGTHENING EXERCISES - Impingement Syndrome (Rotator Cuff Tendinitis, Bursitis) These exercises may help you when beginning to rehabilitate your injury. They may resolve your symptoms with or without further involvement from your physician, physical therapist or athletic trainer. While completing these exercises, remember:  Muscles can gain both the endurance and the strength needed for everyday activities through controlled exercises.  Complete these exercises as instructed by your physician, physical therapist or athletic trainer. Increase the  resistance and repetitions only as guided.  You may experience muscle soreness or fatigue, but the pain or discomfort you are trying to eliminate should never worsen during these exercises. If this pain does get worse, stop and make sure you are following the directions exactly. If the pain is still present after adjustments, discontinue the exercise until you can discuss the trouble with your clinician.  During your recovery, avoid activity or exercises which involve actions that place your injured hand or elbow above your head or behind your back or head. These positions stress the tissues which you are trying to heal. STRENGTH - Scapular Depression and Adduction   With good posture, sit on a firm chair. Support your arms in front of you, with pillows, arm rests, or on a  table top. Have your elbows in line with the sides of your body.  Gently draw your shoulder blades down and toward your mid-back spine. Gradually increase the tension, without tensing the muscles along the top of your shoulders and the back of your neck.  Hold for __________ seconds. Slowly release the tension and relax your muscles completely before starting the next repetition.  After you have practiced this exercise, remove the arm support and complete the exercise in standing as well as sitting position. Repeat __________ times. Complete this exercise __________ times per day.  STRENGTH - Shoulder Abductors, Isometric  With good posture, stand or sit about 4-6 inches from a wall, with your right / left side facing the wall.  Bend your right / left elbow. Gently press your right / left elbow into the wall. Increase the pressure gradually, until you are pressing as hard as you can, without shrugging your shoulder or increasing any shoulder discomfort.  Hold for __________ seconds.  Release the tension slowly. Relax your shoulder muscles completely before you begin the next repetition. Repeat __________ times. Complete this exercise __________ times per day.  STRENGTH - External Rotators, Isometric  Keep your right / left elbow at your side and bend it 90 degrees.  Step into a door frame so that the outside of your right / left wrist can press against the door frame without your upper arm leaving your side.  Gently press your right / left wrist into the door frame, as if you were trying to swing the back of your hand away from your stomach. Gradually increase the tension, until you are pressing as hard as you can, without shrugging your shoulder or increasing any shoulder discomfort.  Hold for __________ seconds.  Release the tension slowly. Relax your shoulder muscles completely before you begin the next repetition. Repeat __________ times. Complete this exercise __________ times per  day.  STRENGTH - Supraspinatus   Stand or sit with good posture. Grasp a __________ weight, or an exercise band or tubing, so that your hand is "thumbs-up," like you are shaking hands.  Slowly lift your right / left arm in a "V" away from your thigh, diagonally into the space between your side and straight ahead. Lift your hand to shoulder height or as far as you can, without increasing any shoulder pain. At first, many people do not lift their hands above shoulder height.  Avoid shrugging your right / left shoulder as your arm rises, by keeping your shoulder blade tucked down and toward your mid-back spine.  Hold for __________ seconds. Control the descent of your hand, as you slowly return to your starting position. Repeat __________ times. Complete this exercise __________ times per  day.  STRENGTH - External Rotators  Secure a rubber exercise band or tubing to a fixed object (table, pole) so that it is at the same height as your right / left elbow when you are standing or sitting on a firm surface.  Stand or sit so that the secured exercise band is at your uninjured side.  Bend your right / left elbow 90 degrees. Place a folded towel or small pillow under your right / left arm, so that your elbow is a few inches away from your side.  Keeping the tension on the exercise band, pull it away from your body, as if pivoting on your elbow. Be sure to keep your body steady, so that the movement is coming only from your rotating shoulder.  Hold for __________ seconds. Release the tension in a controlled manner, as you return to the starting position. Repeat __________ times. Complete this exercise __________ times per day.  STRENGTH - Internal Rotators   Secure a rubber exercise band or tubing to a fixed object (table, pole) so that it is at the same height as your right / left elbow when you are standing or sitting on a firm surface.  Stand or sit so that the secured exercise band is at your  right / left side.  Bend your elbow 90 degrees. Place a folded towel or small pillow under your right / left arm so that your elbow is a few inches away from your side.  Keeping the tension on the exercise band, pull it across your body, toward your stomach. Be sure to keep your body steady, so that the movement is coming only from your rotating shoulder.  Hold for __________ seconds. Release the tension in a controlled manner, as you return to the starting position. Repeat __________ times. Complete this exercise __________ times per day.  STRENGTH - Scapular Protractors, Standing   Stand arms length away from a wall. Place your hands on the wall, keeping your elbows straight.  Begin by dropping your shoulder blades down and toward your mid-back spine.  To strengthen your protractors, keep your shoulder blades down, but slide them forward on your rib cage. It will feel as if you are lifting the back of your rib cage away from the wall. This is a subtle motion and can be challenging to complete. Ask your caregiver for further instruction, if you are not sure you are doing the exercise correctly.  Hold for __________ seconds. Slowly return to the starting position, resting the muscles completely before starting the next repetition. Repeat __________ times. Complete this exercise __________ times per day. STRENGTH - Scapular Protractors, Supine  Lie on your back on a firm surface. Extend your right / left arm straight into the air while holding a __________ weight in your hand.  Keeping your head and back in place, lift your shoulder off the floor.  Hold for __________ seconds. Slowly return to the starting position, and allow your muscles to relax completely before starting the next repetition. Repeat __________ times. Complete this exercise __________ times per day. STRENGTH - Scapular Protractors, Quadruped  Get onto your hands and knees, with your shoulders directly over your hands (or  as close as you can be, comfortably).  Keeping your elbows locked, lift the back of your rib cage up into your shoulder blades, so your mid-back rounds out. Keep your neck muscles relaxed.  Hold this position for __________ seconds. Slowly return to the starting position and allow your muscles to  relax completely before starting the next repetition. Repeat __________ times. Complete this exercise __________ times per day.  STRENGTH - Scapular Retractors  Secure a rubber exercise band or tubing to a fixed object (table, pole), so that it is at the height of your shoulders when you are either standing, or sitting on a firm armless chair.  With a palm down grip, grasp an end of the band in each hand. Straighten your elbows and lift your hands straight in front of you, at shoulder height. Step back, away from the secured end of the band, until it becomes tense.  Squeezing your shoulder blades together, draw your elbows back toward your sides, as you bend them. Keep your upper arms lifted away from your body throughout the exercise.  Hold for __________ seconds. Slowly ease the tension on the band, as you reverse the directions and return to the starting position. Repeat __________ times. Complete this exercise __________ times per day. STRENGTH - Shoulder Extensors   Secure a rubber exercise band or tubing to a fixed object (table, pole) so that it is at the height of your shoulders when you are either standing, or sitting on a firm armless chair.  With a thumbs-up grip, grasp an end of the band in each hand. Straighten your elbows and lift your hands straight in front of you, at shoulder height. Step back, away from the secured end of the band, until it becomes tense.  Squeezing your shoulder blades together, pull your hands down to the sides of your thighs. Do not allow your hands to go behind you.  Hold for __________ seconds. Slowly ease the tension on the band, as you reverse the directions  and return to the starting position. Repeat __________ times. Complete this exercise __________ times per day.  STRENGTH - Scapular Retractors and External Rotators   Secure a rubber exercise band or tubing to a fixed object (table, pole) so that it is at the height as your shoulders, when you are either standing, or sitting on a firm armless chair.  With a palm down grip, grasp an end of the band in each hand. Bend your elbows 90 degrees and lift your elbows to shoulder height, at your sides. Step back, away from the secured end of the band, until it becomes tense.  Squeezing your shoulder blades together, rotate your shoulders so that your upper arms and elbows remain stationary, but your fists travel upward to head height.  Hold for __________ seconds. Slowly ease the tension on the band, as you reverse the directions and return to the starting position. Repeat __________ times. Complete this exercise __________ times per day.  STRENGTH - Scapular Retractors and External Rotators, Rowing   Secure a rubber exercise band or tubing to a fixed object (table, pole) so that it is at the height of your shoulders, when you are either standing, or sitting on a firm armless chair.  With a palm down grip, grasp an end of the band in each hand. Straighten your elbows and lift your hands straight in front of you, at shoulder height. Step back, away from the secured end of the band, until it becomes tense.  Step 1: Squeeze your shoulder blades together. Bending your elbows, draw your hands to your chest, as if you are rowing a boat. At the end of this motion, your hands and elbow should be at shoulder height and your elbows should be out to your sides.  Step 2: Rotate your shoulders, to  raise your hands above your head. Your forearms should be vertical and your upper arms should be horizontal.  Hold for __________ seconds. Slowly ease the tension on the band, as you reverse the directions and return to  the starting position. Repeat __________ times. Complete this exercise __________ times per day.  STRENGTH - Scapular Depressors  Find a sturdy chair without wheels, such as a dining room chair.  Keeping your feet on the floor, and your hands on the chair arms, lift your bottom up from the seat, and lock your elbows.  Keeping your elbows straight, allow gravity to pull your body weight down. Your shoulders will rise toward your ears.  Raise your body against gravity by drawing your shoulder blades down your back, shortening the distance between your shoulders and ears. Although your feet should always maintain contact with the floor, your feet should progressively support less body weight, as you get stronger.  Hold for __________ seconds. In a controlled and slow manner, lower your body weight to begin the next repetition. Repeat __________ times. Complete this exercise __________ times per day.    This information is not intended to replace advice given to you by your health care provider. Make sure you discuss any questions you have with your health care provider.   Document Released: 02/28/2005 Document Revised: 03/21/2014 Document Reviewed: 06/12/2008 [employer] Interactive Patient Education [employer].

## 2015-12-14 NOTE — Assessment & Plan Note (Addendum)
Working diagnosis of impingement syndrome. Negative drop arm to suggest acute rotator cuff tear. Reassured by active and passive ROM. Advised conservative therapy with rest, ice/heat, ibuprofen, exercises given to be done as tolerated. If no improvement, will consult ortho.

## 2015-12-14 NOTE — Progress Notes (Signed)
Pre visit review using our clinic review tool, if applicable. No additional management support is needed unless otherwise documented below in the visit note. 

## 2015-12-14 NOTE — Progress Notes (Signed)
Subjective:    Patient ID: Logan Schmidt, male    DOB: 06/01/1990, 25 y.o.   MRN: [id number]  CC: Logan Schmidt is a 25 y.o. male who presents today for an acute visit.    HPI: Patient here for acute visit with chief complaint of left shoulder pain 2-3 weeks ago after shoulder weight lifting. Soreness the day of however then noticed pain never subsided. No popping sounds. No swelling of joint.Pain when moving arm behind back. Noted pain when putting sheets on bed or putting shirt on.Tried ibuprofen however hasn't taken last 4 days. Ibuprofen helped. Pain is not constant.  No fever, chills.     HISTORY:  Past Medical History:  Diagnosis Date  . ADD (attention deficit disorder)    Adderall  . Allergy    Cat, dogs  . Asthma    History reviewed. No pertinent surgical history. Family History  Problem Relation Age of Onset  . Cancer Mother     breast cancer  . Hyperlipidemia Father   . Hypertension Father   . Asthma Sister   . Heart disease Paternal Grandfather   . Hyperlipidemia Paternal Grandfather     Allergies: Penicillins Current Outpatient Prescriptions on File Prior to Visit  Medication Sig Dispense Refill  . albuterol (PROVENTIL HFA;VENTOLIN HFA) 108 (90 Base) MCG/ACT inhaler Inhale 1-2 puffs into the lungs every 6 (six) hours as needed for wheezing or shortness of breath. 1 Inhaler 2  . amphetamine-dextroamphetamine (ADDERALL XR) 10 MG 24 hr capsule Take 1 capsule (10 mg total) by mouth daily. 30 capsule 0  . amphetamine-dextroamphetamine (ADDERALL XR) 10 MG 24 hr capsule Take 1 capsule (10 mg total) by mouth daily. 30 capsule 0  . amphetamine-dextroamphetamine (ADDERALL XR) 10 MG 24 hr capsule Take 1 capsule (10 mg total) by mouth daily. 30 capsule 0   No current facility-administered medications on file prior to visit.     Social History  Substance Use Topics  . Smoking status: Never Smoker  . Smokeless tobacco: Never Used  . Alcohol use 6.0 oz/week    5 Cans of  beer, 5 Shots of liquor per week    Review of Systems  Constitutional: Negative for chills and fever.  Respiratory: Negative for cough.   Cardiovascular: Negative for chest pain and palpitations.  Gastrointestinal: Negative for nausea and vomiting.  Musculoskeletal: Negative for arthralgias, joint swelling and myalgias.      Objective:    BP (!) 118/58   Pulse 66   Temp 97.4 F (36.3 C) (Oral)   Wt 186 lb 6.4 oz (84.6 kg)   SpO2 98%   BMI 23.93 kg/m    Physical Exam  Constitutional: He appears well-developed and well-nourished.  Cardiovascular: Regular rhythm and normal heart sounds.   Pulmonary/Chest: Effort normal and breath sounds normal. No respiratory distress. He has no wheezes. He has no rhonchi. He has no rales.  Musculoskeletal:       Arms: Left Shoulder:   No asymmetry of shoulders when comparing right and left.No pain with palpation over glenohumeral joint lines, [place]ena joint, AC joint, or bicipital groove. No pain with internal and external rotation. Mild pain with resisted lateral extension .   Negative active painful arc sign. Negative passive arc ( Neer's). Negative drop arm. No pain, swelling, or ecchymosis noted over long head of biceps.  Strength and sensation normal BUE's.   Neurological: He is alert.  Skin: Skin is warm and dry.  Psychiatric: He has a normal mood and affect.  His speech is normal and behavior is normal.  Vitals reviewed.      Assessment & Plan:   Problem List Items Addressed This Visit      Other   Left shoulder pain    Working diagnosis of impingement syndrome. Negative drop arm to suggest acute rotator cuff tear. Reassured by active and passive ROM. Advised conservative therapy with rest, ice/heat, ibuprofen. If no improvement, will consult ortho.        Other Visit Diagnoses   None.       I am having Logan Schmidt maintain his amphetamine-dextroamphetamine, amphetamine-dextroamphetamine, amphetamine-dextroamphetamine, and  albuterol.   No orders of the defined types were placed in this encounter.   Return precautions given.   Risks, benefits, and alternatives of the medications and treatment plan prescribed today were discussed, and patient expressed understanding.   Education regarding symptom management and diagnosis given to patient on AVS.  Continue to follow with Rennie Plowman, FNP for routine health maintenance.   Logan Riffle and I agreed with plan.   Rennie Plowman, FNP

## 2016-01-14 DIAGNOSIS — D225 Melanocytic nevi of trunk: Secondary | ICD-10-CM | POA: Diagnosis not present

## 2016-02-24 ENCOUNTER — Ambulatory Visit (INDEPENDENT_AMBULATORY_CARE_PROVIDER_SITE_OTHER): Payer: BLUE CROSS/BLUE SHIELD | Admitting: Family

## 2016-02-24 ENCOUNTER — Encounter: Payer: Self-pay | Admitting: Family

## 2016-02-24 VITALS — BP 130/80 | HR 62 | Temp 97.5°F | Ht 74.0 in | Wt 183.9 lb

## 2016-02-24 DIAGNOSIS — M25512 Pain in left shoulder: Secondary | ICD-10-CM

## 2016-02-24 DIAGNOSIS — F988 Other specified behavioral and emotional disorders with onset usually occurring in childhood and adolescence: Secondary | ICD-10-CM | POA: Diagnosis not present

## 2016-02-24 DIAGNOSIS — J45909 Unspecified asthma, uncomplicated: Secondary | ICD-10-CM | POA: Diagnosis not present

## 2016-02-24 MED ORDER — AMPHETAMINE-DEXTROAMPHET ER 10 MG PO CP24: 10.0000 mg | ORAL_CAPSULE | Freq: Every day | ORAL | 0 refills | Status: DC

## 2016-02-24 MED ORDER — ALBUTEROL SULFATE HFA 108 (90 BASE) MCG/ACT IN AERS: 1.0000 | INHALATION_SPRAY | Freq: Four times a day (QID) | RESPIRATORY_TRACT | 2 refills | Status: DC | PRN

## 2016-02-24 NOTE — Assessment & Plan Note (Signed)
Stable. Controlled. Refilled medication as requested.

## 2016-02-24 NOTE — Progress Notes (Signed)
Subjective:    Patient ID: Logan Schmidt, male    DOB: 01/31/1991, 25 y.o.   MRN: [id number]  CC: Logan RiffleMichael Svehla is a 25 y.o. male who presents today for follow up.   HPI: Follow-up for left shoulder pain. Patient was seen 2 months ago.  Conservative treatment including exercise for pain with little improvement. Had been doing little exercise and getting better. Pain aggravated by movement. Describes as dull ache. Hurting to put on shirt or pull covers up. No pain when sleeping on left arm. Pain would improve with ibuprofen, heat/ice.   Asthma- needs refill of albuterol. Triggered by cold weather. No cough, wheezing    HISTORY:  Past Medical History:  Diagnosis Date  . ADD (attention deficit disorder)    Adderall  . Allergy    Cat, dogs  . Asthma    History reviewed. No pertinent surgical history. Family History  Problem Relation Age of Onset  . Cancer Mother     breast cancer  . Hyperlipidemia Father   . Hypertension Father   . Asthma Sister   . Heart disease Paternal Grandfather   . Hyperlipidemia Paternal Grandfather     Allergies: Penicillins No current outpatient prescriptions on file prior to visit.   No current facility-administered medications on file prior to visit.     Social History  Substance Use Topics  . Smoking status: Never Smoker  . Smokeless tobacco: Never Used  . Alcohol use 6.0 oz/week    5 Cans of beer, 5 Shots of liquor per week    Review of Systems  Constitutional: Negative for chills and fever.  Respiratory: Negative for cough, shortness of breath and wheezing.   Cardiovascular: Negative for chest pain and palpitations.  Gastrointestinal: Negative for nausea and vomiting.  Musculoskeletal: Negative for back pain, joint swelling and neck pain.      Objective:    BP 130/80   Pulse 62   Temp 97.5 F (36.4 C) (Oral)   Ht 6\' 2"  (1.88 m)   Wt 183 lb 14.4 oz (83.4 kg)   SpO2 99%   BMI 23.61 kg/m  BP Readings from Last 3 Encounters:    02/24/16 130/80  12/14/15 (!) 118/58  10/21/15 114/68   Wt Readings from Last 3 Encounters:  02/24/16 183 lb 14.4 oz (83.4 kg)  12/14/15 186 lb 6.4 oz (84.6 kg)  10/21/15 192 lb (87.1 kg)    Physical Exam  Constitutional: He appears well-developed and well-nourished.  Cardiovascular: Regular rhythm and normal heart sounds.   Pulmonary/Chest: Effort normal and breath sounds normal. No respiratory distress. He has no wheezes. He has no rhonchi. He has no rales.  Musculoskeletal:       Left shoulder: He exhibits normal range of motion, no tenderness, no bony tenderness, no swelling, no deformity, no laceration and no pain.  Left Shoulder:   No asymmetry of shoulders when comparing right and left.No pain with palpation over glenohumeral joint lines, Perry joint, AC joint, or bicipital groove. No pain with internal and external rotation. No pain with resisted lateral extension .   Pain with elevation around 145 degrees.  Negative passive arc ( Neer's). Negative drop arm. No pain, swelling, or ecchymosis noted over long head of biceps.   Strength and sensation normal BUE's.   Neurological: He is alert.  Skin: Skin is warm and dry.  Psychiatric: He has a normal mood and affect. His speech is normal and behavior is normal.  Vitals reviewed.  Assessment & Plan:   Problem List Items Addressed This Visit      Respiratory   Asthma    Stable. Controlled. Refilled medication as requested.      Relevant Medications   albuterol (PROVENTIL HFA;VENTOLIN HFA) 108 (90 Base) MCG/ACT inhaler     Other   ADD (attention deficit disorder)    Stable. Well-controlled on current regimen. Refilled 3 months supply      Relevant Medications   amphetamine-dextroamphetamine (ADDERALL XR) 10 MG 24 hr capsule   amphetamine-dextroamphetamine (ADDERALL XR) 10 MG 24 hr capsule   amphetamine-dextroamphetamine (ADDERALL XR) 10 MG 24 hr capsule   Left shoulder pain - Primary    Now considering  working diagnosis of rotator cuff tendinopathy. Shoulder pain failed conservative treatment; patient and I jointly agreed a referral to orthopedics as patient may benefit from joint injection. We also consider physical therapy however we will wait on orthopedics consult prior to this consult. Ibuprofen, heat, ice      Relevant Orders   Ambulatory referral to Sports Medicine       I am having Mr. Newbury maintain his amphetamine-dextroamphetamine, amphetamine-dextroamphetamine, amphetamine-dextroamphetamine, and albuterol.   Meds ordered this encounter  Medications  . amphetamine-dextroamphetamine (ADDERALL XR) 10 MG 24 hr capsule    Sig: Take 1 capsule (10 mg total) by mouth daily.    Dispense:  30 capsule    Refill:  0    Fill on or after 04/03/2015.    Order Specific Question:   Supervising Provider    Answer:   Sherlene ShamsULLO, TERESA L [[id number]]  . amphetamine-dextroamphetamine (ADDERALL XR) 10 MG 24 hr capsule    Sig: Take 1 capsule (10 mg total) by mouth daily.    Dispense:  30 capsule    Refill:  0    Fill after 03/02/16.    Order Specific Question:   Supervising Provider    Answer:   Sherlene ShamsULLO, TERESA L [[id number]]  . amphetamine-dextroamphetamine (ADDERALL XR) 10 MG 24 hr capsule    Sig: Take 1 capsule (10 mg total) by mouth daily.    Dispense:  30 capsule    Refill:  0    Fill on or after 05/04/2015    Order Specific Question:   Supervising Provider    Answer:   Duncan DullULLO, TERESA L [[id number]]  . albuterol (PROVENTIL HFA;VENTOLIN HFA) 108 (90 Base) MCG/ACT inhaler    Sig: Inhale 1-2 puffs into the lungs every 6 (six) hours as needed for wheezing or shortness of breath.    Dispense:  1 Inhaler    Refill:  2    Order Specific Question:   Supervising Provider    Answer:   Sherlene ShamsULLO, TERESA L [[id number]]    Return precautions given.   Risks, benefits, and alternatives of the medications and treatment plan prescribed today were discussed, and patient expressed understanding.   Education regarding symptom  management and diagnosis given to patient on AVS.  Continue to follow with Rennie PlowmanMargaret Jules Baty, FNP for routine health maintenance.   Logan RiffleMichael Bright and I agreed with plan.   Rennie PlowmanMargaret Melvyn Hommes, FNP

## 2016-02-24 NOTE — Assessment & Plan Note (Signed)
Now considering working diagnosis of rotator cuff tendinopathy. Shoulder pain failed conservative treatment; patient and I jointly agreed a referral to orthopedics as patient may benefit from joint injection. We also consider physical therapy however we will wait on orthopedics consult prior to this consult. Ibuprofen, heat, ice

## 2016-02-24 NOTE — Patient Instructions (Signed)
Referral to sports med Ibuprofen with food; may take 600mg  every 8 hours when pain is really bad; this is only for short periods of time as concern for GI upset/bleed  Happy holidays

## 2016-02-24 NOTE — Progress Notes (Signed)
Pre visit review using our clinic review tool, if applicable. No additional management support is needed unless otherwise documented below in the visit note. 

## 2016-02-24 NOTE — Assessment & Plan Note (Signed)
Stable. Well-controlled on current regimen. Refilled 3 months supply

## 2016-02-25 ENCOUNTER — Encounter: Payer: Self-pay | Admitting: Family

## 2016-03-16 NOTE — Progress Notes (Deleted)
[facility] [address] [place], [place] [address] Phone: [phone] Subjective:    I'm seeing this patient by the request  of:  Rennie Plowman, FNP   CC: Left shoulder pain  [id number]  Logan Schmidt is a 26 y.o. male coming in with complaint of left shoulder pain. Has had pain for about 3-4 months. Nasal weight lifting. States that unfortunately has a dull throbbing aching pain at all times. Denies any swelling. States that the pain seems be on the posterior aspect more than the anterior aspect. No significant radiation down the arm. Patient's on primary care provider 3 months ago and was given exercises. Did not have any significant improvement with the conservative therapy and was referred here for further evaluation. Patient states     Past Medical History:  Diagnosis Date  . ADD (attention deficit disorder)    Adderall  . Allergy    Cat, dogs  . Asthma    No past surgical history on file. Social History   Social History  . Marital status: Single    Spouse name: N/A  . Number of children: 0  . Years of education: 14   Occupational History  . Accounting and Supply Chain     [employer] [place]   Social History Main Topics  . Smoking status: Never Smoker  . Smokeless tobacco: Never Used  . Alcohol use 6.0 oz/week    5 Cans of beer, 5 Shots of liquor per week  . Drug use: No  . Sexual activity: Yes   Other Topics Concern  . Not on file   Social History Narrative   Randie "Karleen Hampshire" grew up in [place], [place]. 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 He is living in [place], [place]. He enjoys playing sports and attends the gym. He enjoys spending time with friends.      Exercise- goes to gym 4-5 days/ week   Diet-regular      Allergies  Allergen Reactions  . Penicillins Swelling   Family History  Problem Relation Age of Onset  . Cancer Mother     breast  cancer  . Hyperlipidemia Father   . Hypertension Father   . Asthma Sister   . Heart disease Paternal Grandfather   . Hyperlipidemia Paternal Grandfather     Past medical history, social, surgical and family history all reviewed in electronic medical record.  No pertanent information unless stated regarding to the chief complaint.   Review of Systems:Review of systems updated and as accurate as of 03/16/16  No headache, visual changes, nausea, vomiting, diarrhea, constipation, dizziness, abdominal pain, skin rash, fevers, chills, night sweats, weight loss, swollen lymph nodes, body aches, joint swelling, muscle aches, chest pain, shortness of breath, mood changes.   Objective  There were no vitals taken for this visit. Systems examined below as of 03/16/16   General: No apparent distress alert and oriented x3 mood and affect normal, dressed appropriately.  HEENT: Pupils equal, extraocular movements intact  Respiratory: Patient's speak in full sentences and does not appear short of breath  Cardiovascular: No lower extremity edema, non tender, no erythema  Skin: Warm dry intact with no signs of infection or rash on extremities or on axial skeleton.  Abdomen: Soft nontender  Neuro: Cranial nerves II through XII are intact, neurovascularly intact in all extremities with 2+ DTRs and 2+ pulses.  Lymph: No lymphadenopathy of posterior or anterior cervical chain  or axillae bilaterally.  Gait normal with good balance and coordination.  MSK:  Non tender with full range of motion and good stability and symmetric strength and tone of elbows, wrist, hip, knee and ankles bilaterally.  Shoulder: left Inspection reveals no abnormalities, atrophy or asymmetry. Palpation is normal with no tenderness over AC joint or bicipital groove. ROM is full in all planes passively. Rotator cuff strength normal throughout. signs of impingement with positive Neer and Hawkin's tests, but negative empty can  sign. Speeds and Yergason's tests normal. No labral pathology noted with negative Obrien's, negative clunk and good stability. Normal scapular function observed. No painful arc and no drop arm sign. No apprehension sign  MSK US performed of: left This study was ordered, performed, and interpreted by Terrilee FilesZach Smith D.O.  Shoulder:   Supraspinatus:  Appears normal on long and transverse views, Bursal bulge seen with shoulder abduction on impingement view. Infraspinatus:  Appears normal on long and transverse views. Significant increase in Doppler flow Subscapularis:  Appears normal on long and transverse views. Positive bursa Teres Minor:  Appears normal on long and transverse views. AC joint:  Capsule undistended, no geyser sign. Glenohumeral Joint:  Appears normal without effusion. Glenoid Labrum:  Intact without visualized tears. Biceps Tendon:  Appears normal on long and transverse views, no fraying of tendon, tendon located in intertubercular groove, no subluxation with shoulder internal or external rotation.  Impression: Subacromial bursitis  Procedure: Real-time Ultrasound Guided Injection of left glenohumeral joint Device: GE Logiq E  Ultrasound guided injection is preferred based studies that show increased duration, increased effect, greater accuracy, decreased procedural pain, increased response rate with ultrasound guided versus blind injection.  Verbal informed consent obtained.  Time-out conducted.  Noted no overlying erythema, induration, or other signs of local infection.  Skin prepped in a sterile fashion.  Local anesthesia: Topical Ethyl chloride.  With sterile technique and under real time ultrasound guidance:  Joint visualized.  23g 1  inch needle inserted posterior approach. Pictures taken for needle placement. Patient did have injection of 2 cc of 1% lidocaine, 2 cc of 0.5% Marcaine, and 1.0 cc of Kenalog 40 mg/dL. Completed without difficulty  Pain immediately  resolved suggesting accurate placement of the medication.  Advised to call if fevers/chills, erythema, induration, drainage, or persistent bleeding.  Images permanently stored and available for review in the ultrasound unit.  Impression: Technically successful ultrasound guided injection.    Impression and Recommendations:     This case required medical decision making of moderate complexity.      Note: This dictation was prepared with Dragon dictation along with smaller phrase technology. Any transcriptional errors that result from this process are unintentional.

## 2016-03-17 ENCOUNTER — Ambulatory Visit: Payer: BLUE CROSS/BLUE SHIELD | Admitting: Family Medicine

## 2016-03-21 DIAGNOSIS — A09 Infectious gastroenteritis and colitis, unspecified: Secondary | ICD-10-CM | POA: Diagnosis not present

## 2016-03-21 DIAGNOSIS — R1114 Bilious vomiting: Secondary | ICD-10-CM | POA: Diagnosis not present

## 2016-03-30 ENCOUNTER — Telehealth: Payer: Self-pay | Admitting: Family Medicine

## 2016-03-30 NOTE — Progress Notes (Deleted)
Tawana ScaleZach Tanaisha Schmidt D.O. [facility] [address] [place], [place] [address] Phone: [phone] Subjective:    I'm seeing this patient by the request  of:  Rennie PlowmanMargaret Arnett, FNP   CC: Left shoulder pain  LKG:[id number]:Subjective  Logan RiffleMichael Schmidt is a 26 y.o. male coming in with complaint of left shoulder pain. Has had pain for about 3-4 months. Nasal weight lifting. States that unfortunately has a dull throbbing aching pain at all times. Denies any swelling. States that the pain seems be on the posterior aspect more than the anterior aspect. No significant radiation down the arm. Patient's on primary care provider 3 months ago and was given exercises. Did not have any significant improvement with the conservative therapy and was referred here for further evaluation. Patient states     Past Medical History:  Diagnosis Date  . ADD (attention deficit disorder)    Adderall  . Allergy    Cat, dogs  . Asthma    No past surgical history on file. Social History   Social History  . Marital status: Single    Spouse name: N/A  . Number of children: 0  . Years of education: 3416   Occupational History  . Accounting and Supply Chain     [employer]   Social History Main Topics  . Smoking status: Never Smoker  . Smokeless tobacco: Never Used  . Alcohol use 6.0 oz/week    5 Cans of beer, 5 Shots of liquor per week  . Drug use: No  . Sexual activity: Yes   Other Topics Concern  . Not on file   Social History Narrative   Logan NeedleMichael "Logan Schmidt" grew up in [place], [place]. He attended [employer] and obtained his Bachelor's in Paediatric nurseManagerial Accounting. 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 He is living in [place], [place]. He enjoys playing sports and attends the gym. He enjoys spending time with friends.      Exercise- goes to gym 4-5 days/ week   Diet-regular      Allergies  Allergen Reactions  . Penicillins Swelling   Family History  Problem Relation Age of Onset  . Cancer Mother     breast  cancer  . Hyperlipidemia Father   . Hypertension Father   . Asthma Sister   . Heart disease Paternal Grandfather   . Hyperlipidemia Paternal Grandfather     Past medical history, social, surgical and family history all reviewed in electronic medical record.  No pertanent information unless stated regarding to the chief complaint.   Review of Systems:Review of systems updated and as accurate as of 03/30/16  No headache, visual changes, nausea, vomiting, diarrhea, constipation, dizziness, abdominal pain, skin rash, fevers, chills, night sweats, weight loss, swollen lymph nodes, body aches, joint swelling, muscle aches, chest pain, shortness of breath, mood changes.   Objective  There were no vitals taken for this visit. Systems examined below as of 03/30/16   General: No apparent distress alert and oriented x3 mood and affect normal, dressed appropriately.  HEENT: Pupils equal, extraocular movements intact  Respiratory: Patient's speak in full sentences and does not appear short of breath  Cardiovascular: No lower extremity edema, non tender, no erythema  Skin: Warm dry intact with no signs of infection or rash on extremities or on axial skeleton.  Abdomen: Soft nontender  Neuro: Cranial nerves II through XII are intact, neurovascularly intact in all extremities with 2+ DTRs and 2+ pulses.  Lymph: No lymphadenopathy of posterior or anterior cervical chain  or axillae bilaterally.  Gait normal with good balance and coordination.  MSK:  Non tender with full range of motion and good stability and symmetric strength and tone of elbows, wrist, hip, knee and ankles bilaterally.  Shoulder: left Inspection reveals no abnormalities, atrophy or asymmetry. Palpation is normal with no tenderness over AC joint or bicipital groove. ROM is full in all planes passively. Rotator cuff strength normal throughout. signs of impingement with positive Neer and Hawkin's tests, but negative empty can  sign. Speeds and Yergason's tests normal. No labral pathology noted with negative Obrien's, negative clunk and good stability. Normal scapular function observed. No painful arc and no drop arm sign. No apprehension sign  MSK US performed of: left This study was ordered, performed, and interpreted by Terrilee FilesZach Teeghan Hammer D.O.  Shoulder:   Supraspinatus:  Appears normal on long and transverse views, Bursal bulge seen with shoulder abduction on impingement view. Infraspinatus:  Appears normal on long and transverse views. Significant increase in Doppler flow Subscapularis:  Appears normal on long and transverse views. Positive bursa Teres Minor:  Appears normal on long and transverse views. AC joint:  Capsule undistended, no geyser sign. Glenohumeral Joint:  Appears normal without effusion. Glenoid Labrum:  Intact without visualized tears. Biceps Tendon:  Appears normal on long and transverse views, no fraying of tendon, tendon located in intertubercular groove, no subluxation with shoulder internal or external rotation.  Impression: Subacromial bursitis  Procedure: Real-time Ultrasound Guided Injection of left glenohumeral joint Device: GE Logiq E  Ultrasound guided injection is preferred based studies that show increased duration, increased effect, greater accuracy, decreased procedural pain, increased response rate with ultrasound guided versus blind injection.  Verbal informed consent obtained.  Time-out conducted.  Noted no overlying erythema, induration, or other signs of local infection.  Skin prepped in a sterile fashion.  Local anesthesia: Topical Ethyl chloride.  With sterile technique and under real time ultrasound guidance:  Joint visualized.  23g 1  inch Schmidt inserted posterior approach. Pictures taken for Schmidt placement. Patient did have injection of 2 cc of 1% lidocaine, 2 cc of 0.5% Marcaine, and 1.0 cc of Kenalog 40 mg/dL. Completed without difficulty  Pain immediately  resolved suggesting accurate placement of the medication.  Advised to call if fevers/chills, erythema, induration, drainage, or persistent bleeding.  Images permanently stored and available for review in the ultrasound unit.  Impression: Technically successful ultrasound guided injection.    Impression and Recommendations:     This case required medical decision making of moderate complexity.      Note: This dictation was prepared with Dragon dictation along with smaller phrase technology. Any transcriptional errors that result from this process are unintentional.

## 2016-03-30 NOTE — Telephone Encounter (Signed)
Called to reschedule patients appt for today 1/17.  Patient states he was reschedule from a previous appt.  Patient would like to get in soon.

## 2016-03-31 ENCOUNTER — Ambulatory Visit: Payer: BLUE CROSS/BLUE SHIELD | Admitting: Family Medicine

## 2016-04-01 NOTE — Telephone Encounter (Signed)
Patient called back.  Was able to get scheduled for next Friday.  If something comes up please give patient call on Monday.

## 2016-04-04 NOTE — Telephone Encounter (Signed)
Spoke to pt, rescheduled appt for 1.23.18 @ 930am.

## 2016-04-04 NOTE — Progress Notes (Signed)
Tawana ScaleZach Smith D.O. [facility] [address] [place], [place] [address] Phone: [phone] Subjective:    I'm seeing this patient by the request  of:  Rennie PlowmanMargaret Arnett, FNP   CC: Left shoulder pain  BJY:[id number]:Subjective  Pricilla RiffleMichael Larrivee is a 26 y.o. male coming in with complaint of left shoulder pain. Has had pain for about 3-4 months. weight lifting may have caused it. States that unfortunately has a dull throbbing aching pain at all times. Denies any swelling. States that the pain seems be on the posterior aspect more than the anterior aspect. No significant radiation down the arm. Patient's on primary care provider 3 months ago and was given exercises. Did not have any significant improvement with the conservative therapy and was referred here for further evaluation. Patient states Still having some mild discomfort. States that when he raises his hands forward this when he seems to notice it the most. Some quick motions can cause a sharp pain that lasts seconds and then seems to resolve. At first it was starting to affect his sleep but no longer. Over the course of time may be some very minimal improvement but nothing severe.     Past Medical History:  Diagnosis Date  . ADD (attention deficit disorder)    Adderall  . Allergy    Cat, dogs  . Asthma    No past surgical history on file. Social History   Social History  . Marital status: Single    Spouse name: N/A  . Number of children: 0  . Years of education: 6616   Occupational History  . Accounting and Supply Chain     [employer]   Social History Main Topics  . Smoking status: Never Smoker  . Smokeless tobacco: Never Used  . Alcohol use 6.0 oz/week    5 Cans of beer, 5 Shots of liquor per week  . Drug use: No  . Sexual activity: Yes   Other Topics Concern  . None   Social History Narrative   Casimiro NeedleMichael "Karleen HampshireSpencer" grew up in [place], [place]. He attended [employer] and obtained his Bachelor's in Paediatric nurseManagerial Accounting. 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 He is living in [place], [place]. He enjoys playing sports and attends the gym. He enjoys spending time with friends.      Exercise- goes to gym 4-5 days/ week   Diet-regular      Allergies  Allergen Reactions  . Penicillins Swelling   Family History  Problem Relation Age of Onset  . Cancer Mother     breast cancer  . Hyperlipidemia Father   . Hypertension Father   . Asthma Sister   . Heart disease Paternal Grandfather   . Hyperlipidemia Paternal Grandfather     Past medical history, social, surgical and family history all reviewed in electronic medical record.  No pertanent information unless stated regarding to the chief complaint.   Review of Systems:Review of systems updated and as accurate as of 04/05/16  No headache, visual changes, nausea, vomiting, diarrhea, constipation, dizziness, abdominal pain, skin rash, fevers, chills, night sweats, weight loss, swollen lymph nodes, body aches, joint swelling, muscle aches, chest pain, shortness of breath, mood changes.   Objective  Blood pressure 116/70, pulse (!) 55, height 6\' 1"  (1.854 m), weight 181 lb (82.1 kg), SpO2 97 %. Systems examined below as of 04/05/16   General: No apparent distress alert and oriented x3 mood and affect normal, dressed appropriately.  HEENT: Pupils equal, extraocular movements intact  Respiratory: Patient's speak in full sentences and does not appear short of breath  Cardiovascular: No lower extremity edema, non tender, no erythema  Skin: Warm dry intact with no signs of infection or rash on extremities or on axial skeleton.  Abdomen: Soft nontender  Neuro: Cranial nerves II through XII are intact, neurovascularly intact in all extremities with 2+ DTRs and 2+ pulses.  Lymph: No lymphadenopathy of posterior or anterior cervical chain or axillae bilaterally.  Gait normal with good balance and coordination.  MSK:  Non tender with full range of motion and good stability  and symmetric strength and tone of elbows, wrist, hip, knee and ankles bilaterally.  Shoulder: left Inspection reveals no abnormalities, atrophy or asymmetry. Palpation is normal with no tenderness over AC joint or bicipital groove. ROM is full in all planes passively. Rotator cuff strength normal throughout. signs of impingement with positive Neer and Hawkin's tests, but negative empty can sign. Speeds and Yergason's tests normal. No labral pathology noted with negative Obrien's, negative clunk and good stability. Normal scapular function observed. No painful arc and no drop arm sign. No apprehension sign  MSK US performed of: left This study was ordered, performed, and interpreted by Terrilee Files D.O.  Shoulder:   Supraspinatus:  Patient has what appears to be in tear but seems to be healing appropriately. Possible degenerative area and no retraction noted. Infraspinatus:  Appears normal on long and transverse views. Significant increase in Doppler flow Subscapularis:  Mild tear still noted. Seems to be intersubstance. No retraction. Partial thickness. Teres Minor:  Appears normal on long and transverse views. AC joint:  Capsule undistended, no geyser sign. Glenohumeral Joint:  Appears normal without effusion. Glenoid Labrum:  Intact without visualized tears. Biceps Tendon:  Appears normal on long and transverse views, no fraying of tendon, tendon located in intertubercular groove, no subluxation with shoulder internal or external rotation.  Impression: Healing rotator cuff tear  Procedure note 97110; 15 minutes spent for Therapeutic exercises as stated in above notes.  This included exercises focusing on stretching, strengthening, with significant focus on eccentric aspects.  Shoulder Exercises that included:  Basic scapular stabilization to include adduction and depression of scapula Scaption, focusing on proper movement and good control Internal and External rotation utilizing a  theraband, with elbow tucked at side entire time Rows with theraband   Proper technique shown and discussed handout in great detail with ATC.  All questions were discussed and answered.      Impression and Recommendations:     This case required medical decision making of moderate complexity.      Note: This dictation was prepared with Dragon dictation along with smaller phrase technology. Any transcriptional errors that result from this process are unintentional.

## 2016-04-05 ENCOUNTER — Encounter: Payer: Self-pay | Admitting: Family Medicine

## 2016-04-05 ENCOUNTER — Ambulatory Visit (INDEPENDENT_AMBULATORY_CARE_PROVIDER_SITE_OTHER): Payer: BLUE CROSS/BLUE SHIELD | Admitting: Family Medicine

## 2016-04-05 ENCOUNTER — Ambulatory Visit: Payer: Self-pay

## 2016-04-05 VITALS — BP 116/70 | HR 55 | Ht 73.0 in | Wt 181.0 lb

## 2016-04-05 DIAGNOSIS — M75102 Unspecified rotator cuff tear or rupture of left shoulder, not specified as traumatic: Secondary | ICD-10-CM | POA: Insufficient documentation

## 2016-04-05 DIAGNOSIS — M75112 Incomplete rotator cuff tear or rupture of left shoulder, not specified as traumatic: Secondary | ICD-10-CM | POA: Diagnosis not present

## 2016-04-05 DIAGNOSIS — M25512 Pain in left shoulder: Secondary | ICD-10-CM

## 2016-04-05 MED ORDER — DICLOFENAC SODIUM 2 % TD SOLN: 2.0000 "application " | Freq: Two times a day (BID) | TRANSDERMAL | 3 refills | Status: DC

## 2016-04-05 NOTE — Assessment & Plan Note (Signed)
I believe looking at the ultrasound patient did have a rotator cuff tear but appears to be healing. Patient will try home exercises, topical anti-inflammatories, icing protocol. We discussed starting to lift again very slowly. Worsening symptoms may need x-rays. May also need formal physical therapy. Think the patient will do well. Follow-up again in 4 weeks.

## 2016-04-05 NOTE — Patient Instructions (Signed)
Good to see you  Ice 20 minutes 2 times daily. Usually after activity and before bed. Exercises 3 times a week.  pennsaid pinkie amount topically 2 times daily as needed.  Keep hands within peripheral vision.  OK to lift 25% of what you were doing and increase 10-20% a week.  See me again in 4 weeks.

## 2016-04-08 ENCOUNTER — Ambulatory Visit: Payer: BLUE CROSS/BLUE SHIELD | Admitting: Family Medicine

## 2016-05-05 ENCOUNTER — Ambulatory Visit: Payer: BLUE CROSS/BLUE SHIELD | Admitting: Family Medicine

## 2016-05-14 NOTE — Progress Notes (Signed)
[facility] [address] [place], [place] [address] Phone: [phone] Subjective:    I'm seeing this patient by the request  of:  Rennie Plowman, FNP   CC: Left shoulder pain f/u  [id number]  Logan Schmidt is a 27 y.o. male coming in with complaint of left shoulder pain. Rotator cuff tear. Patient is trying conservative therapy. States that the topical anti-inflammatories help out significantly. Doing the home exercises. Would state that he is approximately 60-75% better.     Past Medical History:  Diagnosis Date  . ADD (attention deficit disorder)    Adderall  . Allergy    Cat, dogs  . Asthma    No past surgical history on file. Social History   Social History  . Marital status: Single    Spouse name: N/A  . Number of children: 0  . Years of education: 52   Occupational History  . Accounting and Supply Chain     [employer]   Social History Main Topics  . Smoking status: Never Smoker  . Smokeless tobacco: Never Used  . Alcohol use 6.0 oz/week    5 Cans of beer, 5 Shots of liquor per week  . Drug use: No  . Sexual activity: Yes   Other Topics Concern  . None   Social History Narrative   Xaviar "Karleen Hampshire" grew up in [place], [place]. 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 He is living in [place], [place]. He enjoys playing sports and attends the gym. He enjoys spending time with friends.      Exercise- goes to gym 4-5 days/ week   Diet-regular      Allergies  Allergen Reactions  . Penicillins Swelling   Family History  Problem Relation Age of Onset  . Cancer Mother     breast cancer  . Hyperlipidemia Father   . Hypertension Father   . Asthma Sister   . Heart disease Paternal Grandfather   . Hyperlipidemia Paternal Grandfather     Past medical history, social, surgical and family history all reviewed in electronic medical record.  No pertanent  information unless stated regarding to the chief complaint.   Review of Systems: No headache, visual changes, nausea, vomiting, diarrhea, constipation, dizziness, abdominal pain, skin rash, fevers, chills, night sweats, weight loss, swollen lymph nodes, body aches, joint swelling, muscle aches, chest pain, shortness of breath, mood changes.    Objective  Blood pressure 118/74, pulse 71, height 6\' 1"  (1.854 m), weight 188 lb (85.3 kg), SpO2 98 %.   Systems examined below as of 05/16/16 General: NAD A&O x3 mood, affect normal  HEENT: Pupils equal, extraocular movements intact no nystagmus Respiratory: not short of breath at rest or with speaking Cardiovascular: No lower extremity edema, non tender Skin: Warm dry intact with no signs of infection or rash on extremities or on axial skeleton. Abdomen: Soft nontender, no masses Neuro: Cranial nerves  intact, neurovascularly intact in all extremities with 2+ DTRs and 2+ pulses. Lymph: No lymphadenopathy appreciated today  Gait normal with good balance and coordination.  MSK: Non tender with full range of motion and good stability and symmetric strength and tone of elbows, wrist,  knee hips and ankles bilaterally.   Shoulder: left Inspection reveals no abnormalities, atrophy or asymmetry. Palpation is normal with no tenderness over AC joint or bicipital groove. ROM is full in all planes passively. Rotator cuff strength normal throughout. Impingement  sign still remaining very minimal  Speeds and Yergason's tests normal. No labral pathology noted with negative Obrien's, negative clunk and good stability. Normal scapular function observed. No painful arc and no drop arm sign. No apprehension sign Contralateral shoulder unremarkable.      Impression and Recommendations:     This case required medical decision making of moderate complexity.      Note: This dictation was prepared with Dragon dictation along with smaller phrase  technology. Any transcriptional errors that result from this process are unintentional.

## 2016-05-16 ENCOUNTER — Ambulatory Visit (INDEPENDENT_AMBULATORY_CARE_PROVIDER_SITE_OTHER): Payer: BLUE CROSS/BLUE SHIELD | Admitting: Family Medicine

## 2016-05-16 ENCOUNTER — Encounter: Payer: Self-pay | Admitting: Family Medicine

## 2016-05-16 DIAGNOSIS — M75112 Incomplete rotator cuff tear or rupture of left shoulder, not specified as traumatic: Secondary | ICD-10-CM

## 2016-05-16 NOTE — Assessment & Plan Note (Signed)
Do have tear but improving.  Increased activities. Discussed which activities to avoid

## 2016-05-16 NOTE — Patient Instructions (Signed)
Good to see you  Logan Schmidt is your friend.  Keep doing the exercises 2-3 times a week Pennsaid when needed.  You should do great  See me again in 4-6 weeks.

## 2016-06-21 ENCOUNTER — Ambulatory Visit: Payer: BLUE CROSS/BLUE SHIELD | Admitting: Family Medicine

## 2016-07-18 ENCOUNTER — Ambulatory Visit (INDEPENDENT_AMBULATORY_CARE_PROVIDER_SITE_OTHER): Payer: BLUE CROSS/BLUE SHIELD | Admitting: Family

## 2016-07-18 ENCOUNTER — Encounter: Payer: Self-pay | Admitting: Family

## 2016-07-18 VITALS — BP 132/74 | HR 63 | Temp 98.3°F | Wt 189.4 lb

## 2016-07-18 DIAGNOSIS — F988 Other specified behavioral and emotional disorders with onset usually occurring in childhood and adolescence: Secondary | ICD-10-CM | POA: Diagnosis not present

## 2016-07-18 DIAGNOSIS — M75112 Incomplete rotator cuff tear or rupture of left shoulder, not specified as traumatic: Secondary | ICD-10-CM

## 2016-07-18 MED ORDER — AMPHETAMINE-DEXTROAMPHET ER 10 MG PO CP24: 10.0000 mg | ORAL_CAPSULE | Freq: Every day | ORAL | 0 refills | Status: DC

## 2016-07-18 NOTE — Assessment & Plan Note (Signed)
Doing well on medication. Refilled. I looked up patient on  Controlled Substances Reporting System and saw no activity that raised concern of inappropriate use.   

## 2016-07-18 NOTE — Progress Notes (Signed)
Subjective:    Patient ID: Logan Schmidt, male    DOB: [birth year], 26 y.o.   MRN: [id number]  CC: Logan Schmidt is a 26 y.o. male who presents today for an acute visit.    HPI: CC: left arm 'tingling', worsening over the past 2 weeks. Intermittent ' only during stretches.'  Numbness starts at shoulder and goes to elbow.  tingling since started from exercises- primarily with chest exercises with butterfly. Has been using resistance bands and 'very quickly noticed diminished pain' ; about that same time, stopped excercise, and pain in left shoulder has come back a little.    Noticed 'lump' on left collar bone. No injury. No pain,  Tenderness.   No HA, vision changes.                                                                                                                                                                                                                                                    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 Doing well on medication and helps with concentration. No CP, increased anxiety, trouble sleeping.      HISTORY:  Past Medical History:  Diagnosis Date  . ADD (attention deficit disorder)    Adderall  . Allergy    Cat, dogs  . Asthma    No past surgical history on file. Family History  Problem Relation Age of Onset  . Cancer Mother     breast cancer  . Hyperlipidemia Father   . Hypertension Father   . Asthma Sister   . Heart disease Paternal Grandfather   . Hyperlipidemia Paternal Grandfather     Allergies: Penicillins Current Outpatient Prescriptions on File Prior to Visit  Medication Sig Dispense Refill  . albuterol (PROVENTIL HFA;VENTOLIN HFA) 108 (90 Base) MCG/ACT inhaler Inhale 1-2 puffs into the lungs every 6 (six) hours as needed for wheezing or shortness of breath. 1 Inhaler 2  . Diclofenac Sodium (PENNSAID) 2 % SOLN Place 2 application onto the skin 2 (two) times daily. 112 g 3    No current facility-administered medications on file prior to visit.     Social History  Substance Use Topics  . Smoking status: Never Smoker  . Smokeless tobacco: Never Used  . Alcohol use 6.0 oz/week  5 Cans of beer, 5 Shots of liquor per week    Review of Systems  Constitutional: Negative for chills and fever.  Eyes: Negative for visual disturbance.  Respiratory: Negative for cough.   Cardiovascular: Negative for chest pain and palpitations.  Gastrointestinal: Negative for nausea and vomiting.  Musculoskeletal: Negative for back pain and joint swelling.  Neurological: Positive for numbness. Negative for weakness and headaches.      Objective:    BP 132/74 (BP Location: Right Arm, Patient Position: Sitting, Cuff Size: Normal)   Pulse 63   Temp 98.3 F (36.8 C) (Oral)   Wt 189 lb 6 oz (85.9 kg)   SpO2 98%   BMI 24.99 kg/m    Physical Exam  Constitutional: He appears well-developed and well-nourished.  Cardiovascular: Regular rhythm and normal heart sounds.   Pulmonary/Chest: Effort normal and breath sounds normal. No respiratory distress. He has no wheezes. He has no rhonchi. He has no rales.  Musculoskeletal:       Left shoulder: He exhibits normal range of motion, no tenderness, no bony tenderness, no swelling, no pain and no spasm.  Left Shoulder:   No asymmetry of shoulders when comparing right and left.No pain with palpation over glenohumeral joint lines, [place] joint, AC joint, or bicipital groove. No pain with internal and external rotation. No pain with resisted lateral extension .   Negative active painful arc sign. Negative passive arc ( Neer's). Negative drop arm. No pain, swelling, or ecchymosis noted over long head of biceps. Bicep pain with resisted flexion and extension.  Negative speeds test. Negative Popeye's sign.   Strength and sensation normal BUE's.   Area proximal to left clavile does appear slightlty larger than right side. No tenderness,  step off of bilateral clavicle.   Lymphadenopathy:    He has no cervical adenopathy.       Right cervical: No superficial cervical, no deep cervical and no posterior cervical adenopathy present.      Left cervical: No superficial cervical, no deep cervical and no posterior cervical adenopathy present.  No clavicular lymph node appreciated.   Neurological: He is alert.  Skin: Skin is warm and dry.  Psychiatric: He has a normal mood and affect. His speech is normal and behavior is normal.  Vitals reviewed.      Assessment & Plan:   Problem List Items Addressed This Visit      Musculoskeletal and Integument   Rotator cuff tear, left    Suspect that tingling which patient describes is from nerve impingement at level of shoulder, exacerbated by exercise. Advised to avoid aggravating exercises for now. Trial of voltaren . Heat and ice. Pending [place] of soft tissue to be sure no lymphadenopathy of clavicular region.       Relevant Orders   US Soft Tissue Head/Neck     Other   ADD (attention deficit disorder)    Doing well on medication. Refilled. I looked up patient on [place] Controlled Substances Reporting System and saw no activity that raised concern of inappropriate use.        Relevant Medications   amphetamine-dextroamphetamine (ADDERALL XR) 10 MG 24 hr capsule   amphetamine-dextroamphetamine (ADDERALL XR) 10 MG 24 hr capsule   amphetamine-dextroamphetamine (ADDERALL XR) 10 MG 24 hr capsule         I am having [name] maintain his albuterol, Diclofenac Sodium, cetirizine, amphetamine-dextroamphetamine, amphetamine-dextroamphetamine, and amphetamine-dextroamphetamine.   Meds ordered this encounter  Medications  . cetirizine (ZYRTEC) 10 MG tablet  Sig: Take 10 mg by mouth daily.  Marland Kitchen. amphetamine-dextroamphetamine (ADDERALL XR) 10 MG 24 hr capsule    Sig: Take 1 capsule (10 mg total) by mouth daily.    Dispense:  30 capsule    Refill:  0    Order Specific Question:    Supervising Provider    Answer:   Duncan DullULLO, TERESA L [[id number]]  . amphetamine-dextroamphetamine (ADDERALL XR) 10 MG 24 hr capsule    Sig: Take 1 capsule (10 mg total) by mouth daily.    Dispense:  30 capsule    Refill:  0    Fill after 08/19/15.    Order Specific Question:   Supervising Provider    Answer:   Sherlene ShamsULLO, TERESA L [[id number]]  . amphetamine-dextroamphetamine (ADDERALL XR) 10 MG 24 hr capsule    Sig: Take 1 capsule (10 mg total) by mouth daily.    Dispense:  30 capsule    Refill:  0    Fill on or after 09/18/2015    Order Specific Question:   Supervising Provider    Answer:   Sherlene ShamsULLO, TERESA L [[id number]]    Return precautions given.   Risks, benefits, and alternatives of the medications and treatment plan prescribed today were discussed, and patient expressed understanding.   Education regarding symptom management and diagnosis given to patient on AVS.  Continue to follow with Allegra GranaArnett, Margaret G, FNP for routine health maintenance.   Pricilla RiffleMichael Forse and I agreed with plan.   Rennie PlowmanMargaret Arnett, FNP

## 2016-07-18 NOTE — Assessment & Plan Note (Signed)
Suspect that tingling which patient describes is from nerve impingement at level of shoulder, exacerbated by exercise. Advised to avoid aggravating exercises for now. Trial of voltaren . Heat and ice. Pending US of soft tissue to be sure no lymphadenopathy of clavicular region.

## 2016-07-18 NOTE — Patient Instructions (Signed)
Conservative therapy as discussed for shoulder pain. Limit aggravating exercises. Heat/ ice, Voltaren gel, oral NSAIDs Pending ultrasound of clavicular area. Let me know if not better

## 2016-07-18 NOTE — Progress Notes (Signed)
Pre visit review using our clinic review tool, if applicable. No additional management support is needed unless otherwise documented below in the visit note. 

## 2016-07-27 ENCOUNTER — Ambulatory Visit
Admission: RE | Admit: 2016-07-27 | Discharge: 2016-07-27 | Disposition: A | Discharge status: Home / Self Care | Payer: BLUE CROSS/BLUE SHIELD | Source: Ambulatory Visit | Attending: Family | Admitting: Family

## 2016-07-27 ENCOUNTER — Other Ambulatory Visit: Payer: Self-pay | Admitting: Family

## 2016-07-27 DIAGNOSIS — R591 Generalized enlarged lymph nodes: Secondary | ICD-10-CM | POA: Insufficient documentation

## 2016-07-27 DIAGNOSIS — R599 Enlarged lymph nodes, unspecified: Secondary | ICD-10-CM | POA: Diagnosis not present

## 2016-08-01 ENCOUNTER — Telehealth: Payer: Self-pay | Admitting: Family

## 2016-08-01 NOTE — Telephone Encounter (Signed)
Pt called back returning your call. Thank you!  Call pt @ [phone]

## 2016-08-02 NOTE — Telephone Encounter (Signed)
Patient notified US normal and voiced understanding patient stated he is feeling much better.

## 2016-08-02 NOTE — Telephone Encounter (Signed)
noted 

## 2016-12-27 ENCOUNTER — Ambulatory Visit (INDEPENDENT_AMBULATORY_CARE_PROVIDER_SITE_OTHER): Payer: BLUE CROSS/BLUE SHIELD | Admitting: Family

## 2016-12-27 ENCOUNTER — Encounter: Payer: Self-pay | Admitting: Family

## 2016-12-27 VITALS — BP 128/84 | HR 58 | Temp 98.4°F | Ht 73.0 in | Wt 195.0 lb

## 2016-12-27 DIAGNOSIS — R1310 Dysphagia, unspecified: Secondary | ICD-10-CM

## 2016-12-27 DIAGNOSIS — F988 Other specified behavioral and emotional disorders with onset usually occurring in childhood and adolescence: Secondary | ICD-10-CM | POA: Diagnosis not present

## 2016-12-27 MED ORDER — AMPHETAMINE-DEXTROAMPHET ER 15 MG PO CP24: 15.0000 mg | ORAL_CAPSULE | ORAL | 0 refills | Status: DC

## 2016-12-27 MED ORDER — AMPHETAMINE-DEXTROAMPHET ER 10 MG PO CP24: 10.0000 mg | ORAL_CAPSULE | Freq: Every day | ORAL | 0 refills | Status: DC

## 2016-12-27 NOTE — Progress Notes (Signed)
Subjective:    Patient ID: Logan Schmidt, male    DOB: 04-02-1990, 26 y.o.   MRN: [id number]  CC: Logan Schmidt is a 26 y.o. male who presents today for follow up.   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  No trouble sleeping, increased anxiety, palpitations, chest pain  Complains difficulty swallowing over the past 1-2 years. Intermittent and describes as 4-5 episodes over the past year; every epiosde associated when eating meat. Notes chewed well. No sob, difficultly breathing. Had had to throw up to relieve the pain.  Tried carbonated water with relief. No fever, constipation, n, v, weight loss, epigastric burning.      HISTORY:  Past Medical History:  Diagnosis Date  . ADD (attention deficit disorder)    Adderall  . Allergy    Cat, dogs  . Asthma    History reviewed. No pertinent surgical history. Family History  Problem Relation Age of Onset  . Cancer Mother        breast cancer  . Hyperlipidemia Father   . Hypertension Father   . Asthma Sister   . Heart disease Paternal Grandfather   . Hyperlipidemia Paternal Grandfather     Allergies: Penicillins Current Outpatient Prescriptions on File Prior to Visit  Medication Sig Dispense Refill  . albuterol (PROVENTIL HFA;VENTOLIN HFA) 108 (90 Base) MCG/ACT inhaler Inhale 1-2 puffs into the lungs every 6 (six) hours as needed for wheezing or shortness of breath. 1 Inhaler 2  . cetirizine (ZYRTEC) 10 MG tablet Take 10 mg by mouth daily.    . Diclofenac Sodium (PENNSAID) 2 % SOLN Place 2 application onto the skin 2 (two) times daily. 112 g 3   No current facility-administered medications on file prior to visit.     Social History  Substance Use Topics  . Smoking status: Never Smoker  . Smokeless tobacco: Never Used  . Alcohol use 6.0 oz/week    5 Cans of beer, 5 Shots of liquor per week    Review of Systems    Constitutional: Negative for chills and fever.  HENT: Positive for trouble swallowing.   Respiratory: Negative for cough and shortness of breath.   Cardiovascular: Negative for chest pain and palpitations.  Gastrointestinal: Negative for abdominal pain, blood in stool, constipation, diarrhea, nausea and vomiting.      Objective:    BP 128/84   Pulse (!) 58   Temp 98.4 F (36.9 C) (Oral)   Ht  (1.854 m)   Wt 195 lb (88.5 kg)   SpO2 98%   BMI 25.73 kg/m  BP Readings from Last 3 Encounters:  12/27/16 128/84  07/18/16 132/74  05/16/16 118/74   Wt Readings from Last 3 Encounters:  12/27/16 195 lb (88.5 kg)  07/18/16 189 lb 6 oz (85.9 kg)  05/16/16 188 lb (85.3 kg)    Physical Exam  Constitutional: He appears well-developed and well-nourished.  Cardiovascular: Regular rhythm and normal heart sounds.   Pulmonary/Chest: Effort normal and breath sounds normal. No respiratory distress. He has no wheezes. He has no rhonchi. He has no rales.  Neurological: He is alert.  Skin: Skin is warm and dry.  Psychiatric: He has a normal mood and affect. His speech is normal and behavior is normal.  Vitals reviewed.      Assessment & Plan:   Problem List Items Addressed This Visit      Digestive  Dysphagia - Primary    Nonspecific at this point. Discussed with patient could just be a fluke incidents and perhaps he was not chewing well when he was eating meat however also discussed alternate diagnoses including esophageal stricture, atypical GERD. Patient will trial Zantac. Referral placed to GI for further evaluation as patient may require barium swallow, endoscopy. Return precautions given.      Relevant Orders   Ambulatory referral to Gastroenterology     Other   ADD (attention deficit disorder)    Stable. Increased from 10 mg to 15 mg XR to see if would last longer and allow for studying after work.  I looked up patient on [place] Controlled Substances Reporting System and saw  no activity that raised concern of inappropriate use.        Relevant Medications   amphetamine-dextroamphetamine (ADDERALL XR) 15 MG 24 hr capsule       I am having Logan Schmidt maintain his albuterol, Diclofenac Sodium, cetirizine, and amphetamine-dextroamphetamine.   Meds ordered this encounter  Medications  . DISCONTD: amphetamine-dextroamphetamine (ADDERALL XR) 10 MG 24 hr capsule    Sig: Take 1 capsule (10 mg total) by mouth daily.    Dispense:  30 capsule    Refill:  0    Order Specific Question:   Supervising Provider    Answer:   Duncan Dull L [[id number]]  . DISCONTD: amphetamine-dextroamphetamine (ADDERALL XR) 10 MG 24 hr capsule    Sig: Take 1 capsule (10 mg total) by mouth daily.    Dispense:  30 capsule    Refill:  0    Fill after 01/28/2017    Order Specific Question:   Supervising Provider    Answer:   Duncan Dull L [[id number]]  . DISCONTD: amphetamine-dextroamphetamine (ADDERALL XR) 10 MG 24 hr capsule    Sig: Take 1 capsule (10 mg total) by mouth daily.    Dispense:  30 capsule    Refill:  0    Fill on or after 12/16//2018    Order Specific Question:   Supervising Provider    Answer:   Duncan Dull L [[id number]]  . DISCONTD: amphetamine-dextroamphetamine (ADDERALL XR) 15 MG 24 hr capsule    Sig: Take 1 capsule by mouth every morning.    Dispense:  30 capsule    Refill:  0    Order Specific Question:   Supervising Provider    Answer:   Duncan Dull L [[id number]]  . DISCONTD: amphetamine-dextroamphetamine (ADDERALL XR) 15 MG 24 hr capsule    Sig: Take 1 capsule by mouth every morning.    Dispense:  30 capsule    Refill:  0    Do not fill prior to 01/27/17    Order Specific Question:   Supervising Provider    Answer:   Duncan Dull L [[id number]]  . amphetamine-dextroamphetamine (ADDERALL XR) 15 MG 24 hr capsule    Sig: Take 1 capsule by mouth every morning.    Dispense:  30 capsule    Refill:  0    Do not fill prior to 02/26/17    Order Specific Question:   Supervising  Provider    Answer:   Sherlene Shams [[id number]]    Return precautions given.   Risks, benefits, and alternatives of the medications and treatment plan prescribed today were discussed, and patient expressed understanding.   Education regarding symptom management and diagnosis given to patient on AVS.  Continue to follow with Allegra Grana, FNP for routine health maintenance.  Pricilla Riffle and I agreed with plan.   Rennie Plowman, FNP

## 2016-12-27 NOTE — Assessment & Plan Note (Addendum)
Stable. Increased from 10 mg to 15 mg XR to see if would last longer and allow for studying after work.  I looked up patient on [place] Controlled Substances Reporting System and saw no activity that raised concern of inappropriate use.

## 2016-12-27 NOTE — Patient Instructions (Signed)
Trial of zantac  twice per day before a meal Increase adderall XR to  in the morning  Let me know if not improved  Congrats on wedding!!

## 2016-12-28 ENCOUNTER — Encounter: Payer: Self-pay | Admitting: Family

## 2016-12-28 DIAGNOSIS — R1310 Dysphagia, unspecified: Secondary | ICD-10-CM | POA: Insufficient documentation

## 2016-12-28 NOTE — Assessment & Plan Note (Signed)
Nonspecific at this point. Discussed with patient could just be a fluke incidents and perhaps he was not chewing well when he was eating meat however also discussed alternate diagnoses including esophageal stricture, atypical GERD. Patient will trial Zantac. Referral placed to GI for further evaluation as patient may require barium swallow, endoscopy. Return precautions given.

## 2017-02-06 ENCOUNTER — Encounter: Payer: Self-pay | Admitting: Gastroenterology

## 2017-02-06 ENCOUNTER — Ambulatory Visit (INDEPENDENT_AMBULATORY_CARE_PROVIDER_SITE_OTHER): Payer: BLUE CROSS/BLUE SHIELD | Admitting: Gastroenterology

## 2017-02-06 ENCOUNTER — Other Ambulatory Visit: Payer: Self-pay

## 2017-02-06 DIAGNOSIS — R1310 Dysphagia, unspecified: Secondary | ICD-10-CM | POA: Diagnosis not present

## 2017-02-06 DIAGNOSIS — R1319 Other dysphagia: Secondary | ICD-10-CM

## 2017-02-06 NOTE — Progress Notes (Signed)
Gastroenterology Consultation  Referring Provider:     Allegra GranaArnett, Margaret G, FNP Primary Care Physician:  Allegra GranaArnett, Margaret G, FNP Primary Gastroenterologist:  Dr. Servando SnareWohl     Reason for Consultation:     Dysphagia        HPI:   Logan Schmidt is a 26 y.o. y/o male referred for consultation & management of dysphagia by Dr. Jason CoopArnett, Lyn RecordsMargaret G, FNP.  This patient reports that he has been having problems with swallowing. The patient states that the symptoms have been present for the last 1-2 years. He reports that it doesn't happen all the time and he has only had approximately 5 episodes of this in the past year. There is no problems with drinking water or soft foods but he does report that the problems with bulky things like meat. The patient denies any black stools or bloody stools. He also denies the dysphagia to cause him to have any unexplained weight loss. There is no associated abdominal pain heartburn nausea vomiting fevers or chills. The patient also denies having to go to the emergency room because the food would not go down.       Past Medical History:  Diagnosis Date  . ADD (attention deficit disorder)    Adderall  . Allergy    Cat, dogs  . Asthma     No past surgical history on file.  Prior to Admission medications   Medication Sig Start Date End Date Taking? Authorizing Provider  albuterol (PROVENTIL HFA;VENTOLIN HFA) 108 (90 Base) MCG/ACT inhaler Inhale 1-2 puffs into the lungs every 6 (six) hours as needed for wheezing or shortness of breath. 02/24/16   Allegra GranaArnett, Margaret G, FNP  amphetamine-dextroamphetamine (ADDERALL XR) 15 MG 24 hr capsule Take 1 capsule by mouth every morning. 12/27/16   Allegra GranaArnett, Margaret G, FNP  cetirizine (ZYRTEC) 10 MG tablet Take 10 mg by mouth daily.    [provider]  Diclofenac Sodium (PENNSAID) 2 % SOLN Place 2 application onto the skin 2 (two) times daily. 04/05/16   Judi SaaSmith, Zachary M, DO    Family History  Problem Relation Age of Onset   . Cancer Mother        breast cancer  . Hyperlipidemia Father   . Hypertension Father   . Asthma Sister   . Heart disease Paternal Grandfather   . Hyperlipidemia Paternal Grandfather      Social History   Tobacco Use  . Smoking status: Never Smoker  . Smokeless tobacco: Never Used  Substance Use Topics  . Alcohol use: Yes    Alcohol/week: 6.0 oz    Types: 5 Cans of beer, 5 Shots of liquor per week  . Drug use: No    Allergies as of 02/06/2017 - Review Complete 12/27/2016  Allergen Reaction Noted  . Penicillins Swelling 05/23/2013    Review of Systems:    All systems reviewed and negative except where noted in HPI.   Physical Exam:  There were no vitals taken for this visit. No LMP for male patient. Psych:  Alert and cooperative. Normal mood and affect. General:   Alert,  Well-developed, well-nourished, pleasant and cooperative in NAD Head:  Normocephalic and atraumatic. Eyes:  Sclera clear, no icterus.   Conjunctiva pink. Ears:  Normal auditory acuity. Nose:  No deformity, discharge, or lesions. Mouth:  No deformity or lesions,oropharynx pink & moist. Neck:  Supple; no masses or thyromegaly. Lungs:  Respirations even and unlabored.  Clear throughout to auscultation.   No wheezes, crackles,  or rhonchi. No acute distress. Heart:  Regular rate and rhythm; no murmurs, clicks, rubs, or gallops. Abdomen:  Normal bowel sounds.  No bruits.  Soft, non-tender and non-distended without masses, hepatosplenomegaly or hernias noted.  No guarding or rebound tenderness.  Negative Carnett sign.   Rectal:  Deferred.  Msk:  Symmetrical without gross deformities.  Good, equal movement & strength bilaterally. Pulses:  Normal pulses noted. Extremities:  No clubbing or edema.  No cyanosis. Neurologic:  Alert and oriented x3;  grossly normal neurologically. Skin:  Intact without significant lesions or rashes.  No jaundice. Lymph Nodes:  No significant cervical adenopathy. Psych:  Alert  and cooperative. Normal mood and affect.  Imaging Studies: No results found.  Assessment and Plan:   Logan RiffleMichael Santoni is a 26 y.o. y/o male who comes in with a history of intermittent dysphagia to meats for over a year. The patient may have a esophageal ring versus stricture. The patient may also be suffering from eosinophilic esophagitis. The patient will be set up for an EGD to evaluate the esophagus for the cause of his dysphagia. I have discussed risks & benefits which include, but are not limited to, bleeding, infection, perforation & drug reaction.  The patient agrees with this plan & written consent will be obtained.     Midge Miniumarren Anitha Kreiser, MD. Clementeen GrahamFACG   Note: This dictation was prepared with Dragon dictation along with smaller phrase technology. Any transcriptional errors that result from this process are unintentional.

## 2017-06-21 ENCOUNTER — Other Ambulatory Visit: Payer: Self-pay | Admitting: Family

## 2017-06-21 DIAGNOSIS — F988 Other specified behavioral and emotional disorders with onset usually occurring in childhood and adolescence: Secondary | ICD-10-CM

## 2017-06-26 MED ORDER — AMPHETAMINE-DEXTROAMPHET ER 15 MG PO CP24: 15.0000 mg | ORAL_CAPSULE | ORAL | 0 refills | Status: DC

## 2017-06-26 NOTE — Telephone Encounter (Signed)
Tried calling patient no voicemail set up .  My chart message sent. Scripts placed up front.

## 2017-06-26 NOTE — Telephone Encounter (Signed)
Call pt  I have given him 3 months of refill out of courtesy however he needs to make a f/u since last saw him in the fall since a controlled substance  I looked up patient on [place] Controlled Substances Reporting System and saw no activity that raised concern of inappropriate use.

## 2017-07-06 ENCOUNTER — Encounter: Payer: Self-pay | Admitting: Family

## 2017-07-07 ENCOUNTER — Other Ambulatory Visit: Payer: Self-pay | Admitting: Family

## 2017-07-07 NOTE — Telephone Encounter (Signed)
Tried to reach pt, no answer or voicemail setup. I have sent him a mychart to let him know that his Rx is still up front. CMA tried to leave a message on 06/26/17 as well.

## 2017-07-07 NOTE — Telephone Encounter (Signed)
Called and advised patient scripts ready for pick up .  Patient verbalized understanding.

## 2017-07-19 DIAGNOSIS — H31012 Macula scars of posterior pole (postinflammatory) (post-traumatic), left eye: Secondary | ICD-10-CM | POA: Diagnosis not present

## 2017-07-19 DIAGNOSIS — D3131 Benign neoplasm of right choroid: Secondary | ICD-10-CM | POA: Diagnosis not present

## 2017-10-10 IMAGING — US US SOFT TISSUE HEAD/NECK
1 series · 14 of 20 positions shown · non-contrast
Comparison: None.

CLINICAL DATA: Palpable area involving the left supraclavicular
fossa for the past 3-4 weeks.

EXAM:
ULTRASOUND OF HEAD/NECK SOFT TISSUES
TECHNIQUE: Ultrasound examination of the head and neck soft tissues was
performed in the area of clinical concern.

[Series 1: us soft tissue head/neck · 0.07mm/px · 14 of 20 slices shown]
[im 1/20]
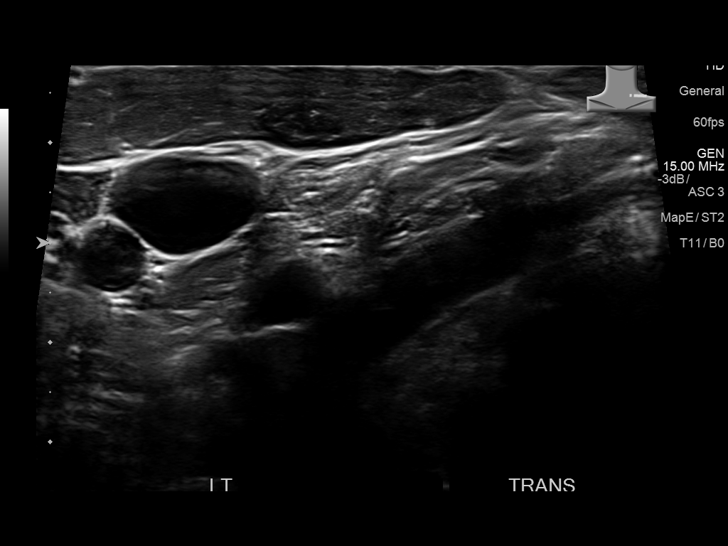
[im 3/20]
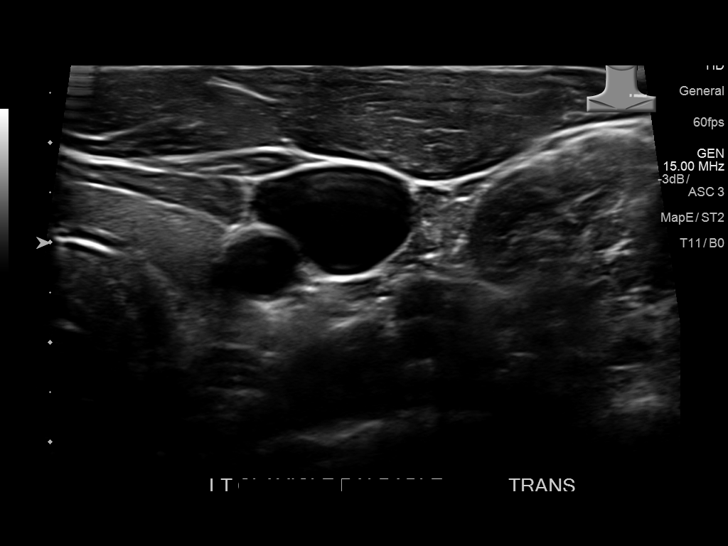
[im 4/20]
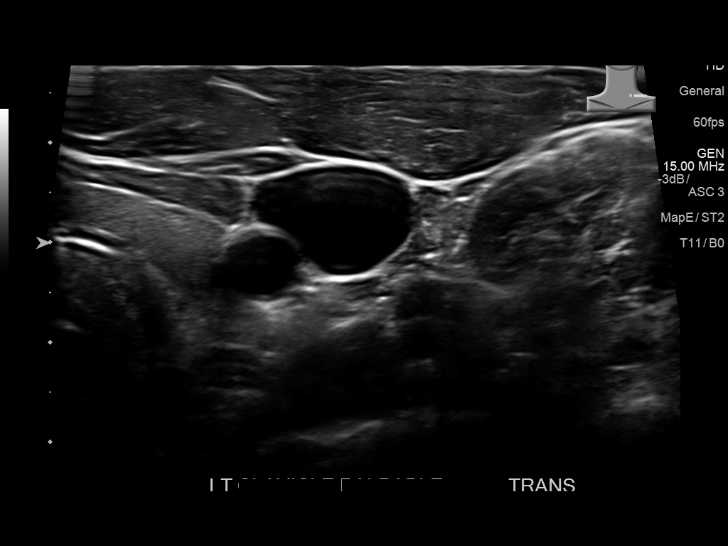
[im 6/20]
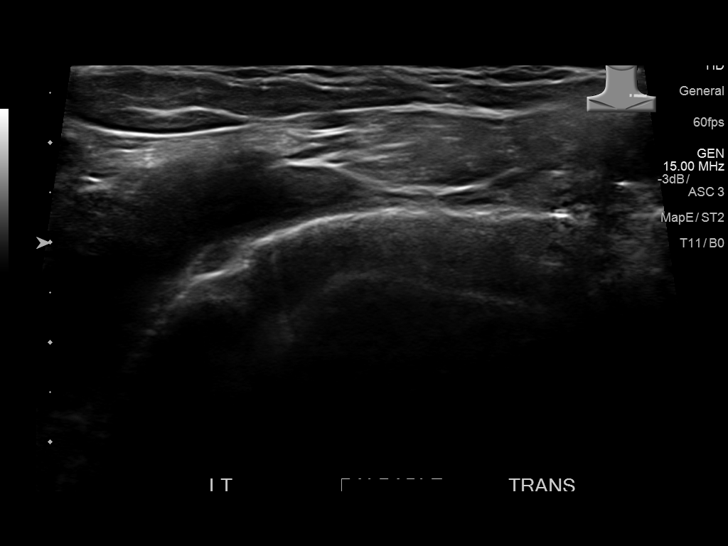
[im 7/20]
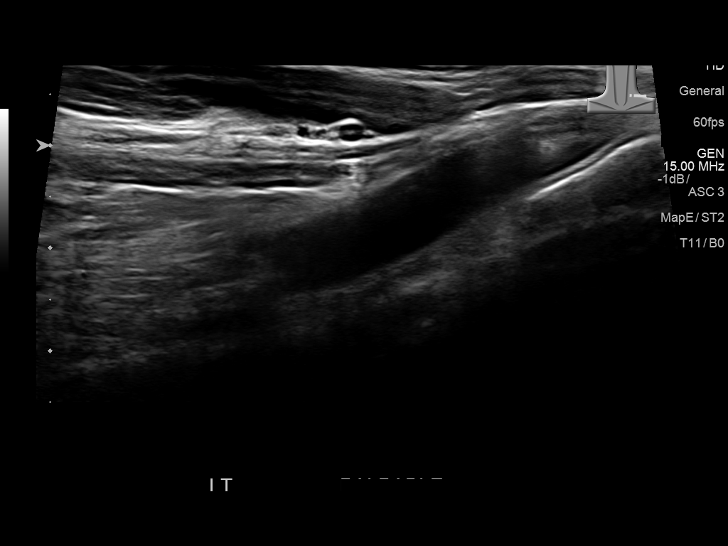
[im 8/20]
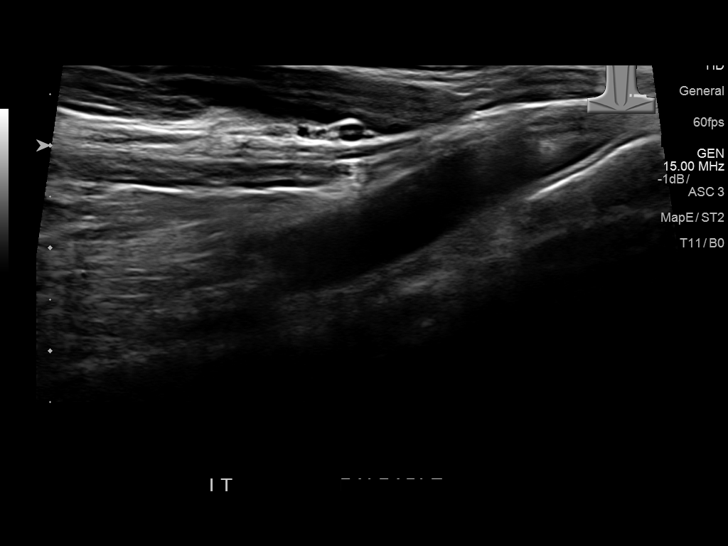
[im 10/20]
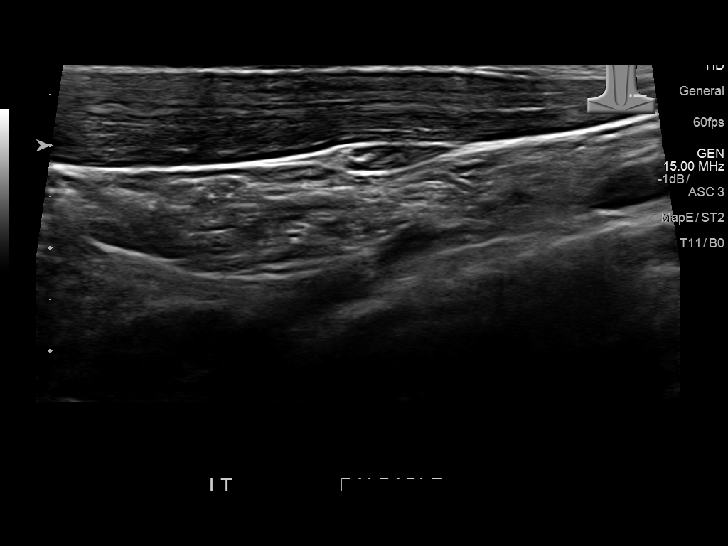
[im 11/20]
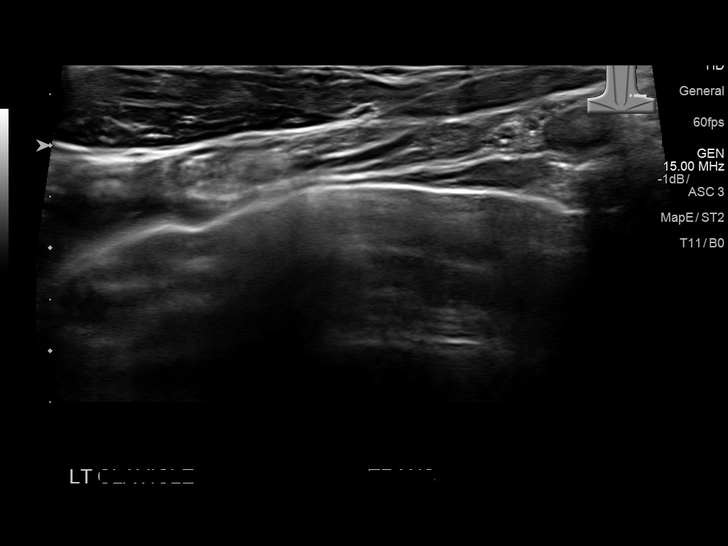
[im 13/20]
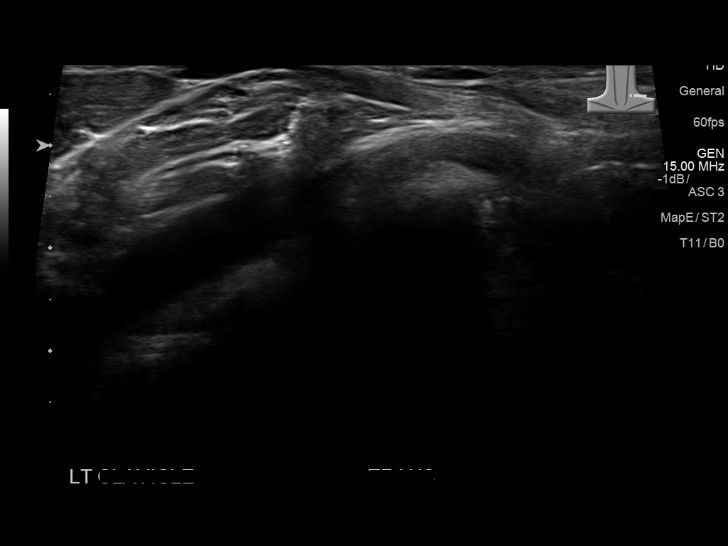
[im 14/20]
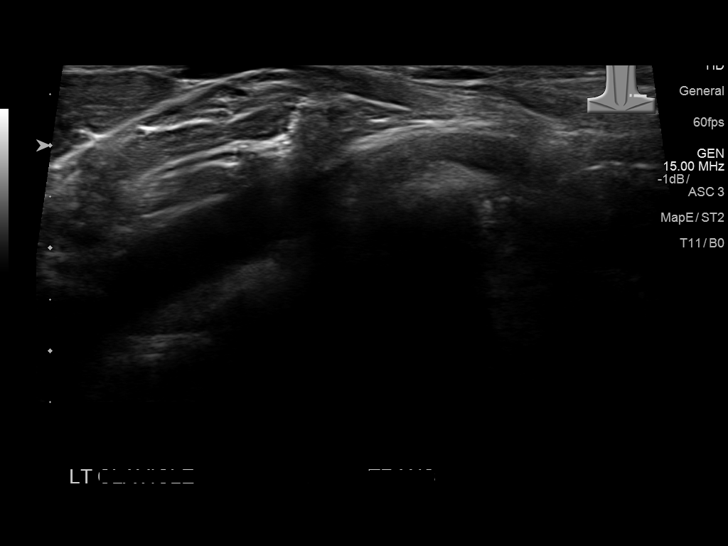
[im 16/20]
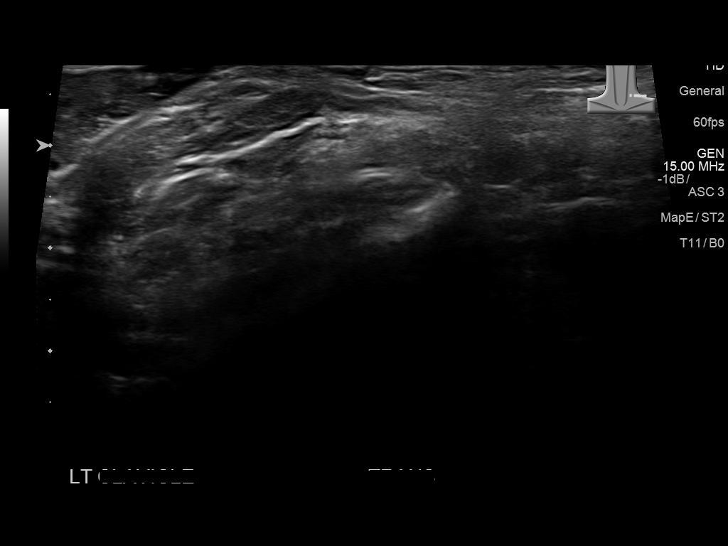
[im 17/20]
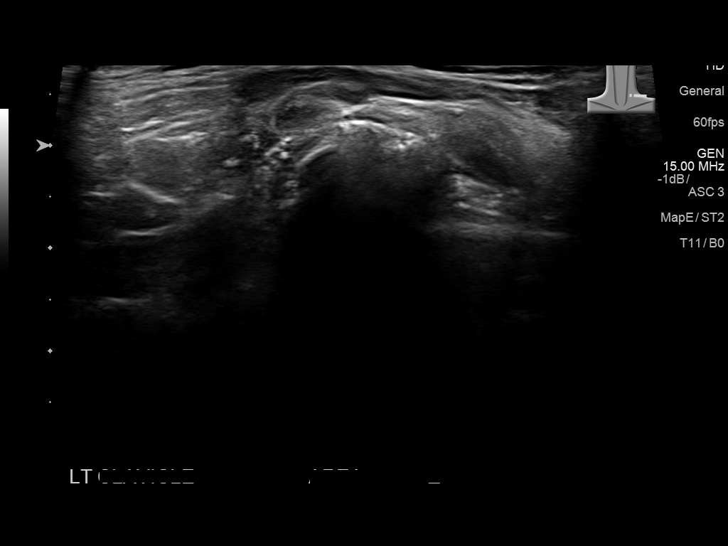
[im 18/20]
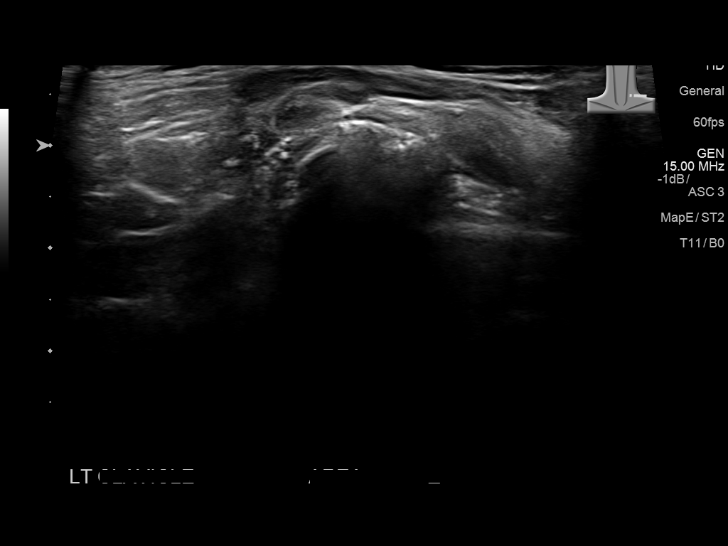
[im 20/20]
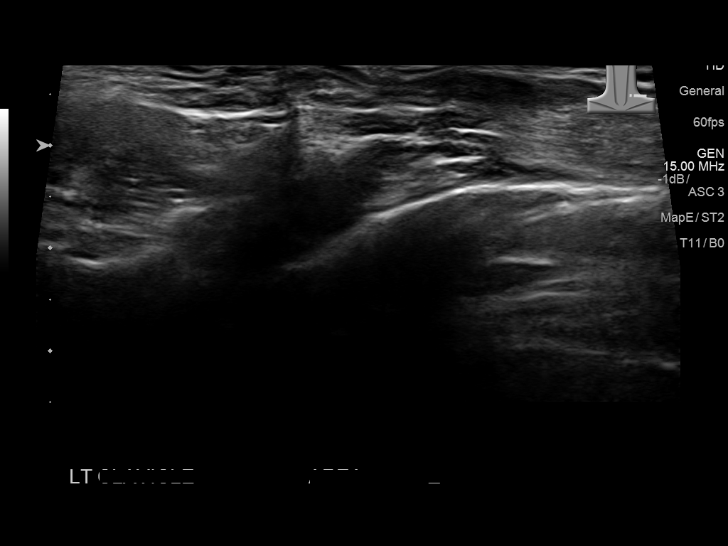

[14 of 20 positions shown; findings below may reference images not displayed]

FINDINGS: No discrete solid or cystic lesions correlate with the patient's
palpable area of concern involving the left supraclavicular fossa.
IMPRESSION: No definitive sonographic correlate for patient's palpable area of
concern involving the left supraclavicular fossa.

## 2017-12-18 ENCOUNTER — Ambulatory Visit: Payer: BLUE CROSS/BLUE SHIELD | Admitting: Family

## 2017-12-18 ENCOUNTER — Encounter: Payer: Self-pay | Admitting: Family

## 2017-12-18 VITALS — BP 140/84 | HR 56 | Temp 98.5°F | Resp 16 | Ht 73.0 in | Wt 185.4 lb

## 2017-12-18 DIAGNOSIS — M75112 Incomplete rotator cuff tear or rupture of left shoulder, not specified as traumatic: Secondary | ICD-10-CM | POA: Diagnosis not present

## 2017-12-18 DIAGNOSIS — R1310 Dysphagia, unspecified: Secondary | ICD-10-CM

## 2017-12-18 DIAGNOSIS — M25512 Pain in left shoulder: Secondary | ICD-10-CM | POA: Diagnosis not present

## 2017-12-18 DIAGNOSIS — F988 Other specified behavioral and emotional disorders with onset usually occurring in childhood and adolescence: Secondary | ICD-10-CM | POA: Diagnosis not present

## 2017-12-18 MED ORDER — AMPHETAMINE-DEXTROAMPHET ER 15 MG PO CP24: 15.0000 mg | ORAL_CAPSULE | ORAL | 0 refills | Status: DC

## 2017-12-18 NOTE — Assessment & Plan Note (Signed)
Stable. Declines further work up at this time. He will let me know if changes.

## 2017-12-18 NOTE — Progress Notes (Signed)
Subjective:    Patient ID: Logan Schmidt, male    DOB: September 06, 1990, 27 y.o.   MRN: [id number]  CC: Logan Schmidt is a 27 y.o. male who presents today for follow up.   HPI: Doing well today  Would like refill for adderall.   Has seen GI regarding dysphagia. This not more frequent.  He declined EGD at that time as symptom not bothersome. Has noticed being more hydrated has helped.   Left shoulder rom has resolved since seen by Ayesha Mohair. Dull ache very rarely. Has noticed weaker than right side. Over past few months, hasnt' done home excercises as he had the past. Found out insurance will cover PT (15 sessions) and would like to pursue for his left shoulder. No medications for pain.      HISTORY:  Past Medical History:  Diagnosis Date  . ADD (attention deficit disorder)    Adderall  . Allergy    Cat, dogs  . Asthma    No past surgical history on file. Family History  Problem Relation Age of Onset  . Cancer Mother        breast cancer  . Hyperlipidemia Father   . Hypertension Father   . Asthma Sister   . Heart disease Paternal Grandfather   . Hyperlipidemia Paternal Grandfather     Allergies: Penicillins Current Outpatient Medications on File Prior to Visit  Medication Sig Dispense Refill  . albuterol (PROVENTIL HFA;VENTOLIN HFA) 108 (90 Base) MCG/ACT inhaler Inhale 1-2 puffs into the lungs every 6 (six) hours as needed for wheezing or shortness of breath. 1 Inhaler 2  . cetirizine (ZYRTEC) 10 MG tablet Take 10 mg by mouth daily.    . Diclofenac Sodium (PENNSAID) 2 % SOLN Place 2 application onto the skin 2 (two) times daily. (Patient not taking: Reported on 12/18/2017) 112 g 3   No current facility-administered medications on file prior to visit.     Social History   Tobacco Use  . Smoking status: Never Smoker  . Smokeless tobacco: Never Used  Substance Use Topics  . Alcohol use: Yes    Alcohol/week: 10.0 standard drinks    Types: 5 Cans of beer, 5 Shots of liquor  per week  . Drug use: No    Review of Systems  Constitutional: Negative for chills and fever.  HENT: Positive for trouble swallowing (unchanged).   Respiratory: Negative for cough.   Cardiovascular: Negative for chest pain and palpitations.  Gastrointestinal: Negative for nausea and vomiting.  Musculoskeletal: Positive for arthralgias (left shoulder).      Objective:    BP 140/84 (BP Location: Left Arm, Patient Position: Sitting, Cuff Size: Normal)   Pulse (!) 56   Temp 98.5 F (36.9 C) (Oral)   Resp 16   Ht 6\' 1"  (1.854 m)   Wt 185 lb 6 oz (84.1 kg)   SpO2 98%   BMI 24.46 kg/m  BP Readings from Last 3 Encounters:  12/18/17 140/84  12/27/16 128/84  07/18/16 132/74   Wt Readings from Last 3 Encounters:  12/18/17 185 lb 6 oz (84.1 kg)  12/27/16 195 lb (88.5 kg)  07/18/16 189 lb 6 oz (85.9 kg)    Physical Exam  Constitutional: He appears well-developed and well-nourished.  Cardiovascular: Regular rhythm and normal heart sounds.  Pulmonary/Chest: Effort normal and breath sounds normal. No respiratory distress. He has no wheezes. He has no rhonchi. He has no rales.  Musculoskeletal:       Left shoulder: He exhibits normal  range of motion, no tenderness, no bony tenderness, no pain and no spasm.  Neurological: He is alert.  Skin: Skin is warm and dry.  Psychiatric: He has a normal mood and affect. His speech is normal and behavior is normal.  Vitals reviewed.      Assessment & Plan:   Problem List Items Addressed This Visit      Digestive   Dysphagia    Stable. Declines further work up at this time. He will let me know if changes.         Musculoskeletal and Integument   Rotator cuff tear, left    Benign exam. Pleased with overall improvement. Reasonable to pursue PT for slight additional benefit.Marland Kitchen Referral placed. Will follow        Other   ADD (attention deficit disorder) - Primary    Stable. Continue regimen. I looked up patient on [place] Controlled  Substances Reporting System and saw no activity that raised concern of inappropriate use.        Relevant Medications   amphetamine-dextroamphetamine (ADDERALL XR) 15 MG 24 hr capsule   Left shoulder pain    Dramatically improved. Pending PT       Relevant Orders   Ambulatory referral to Physical Therapy       I am having Merritt Mccravy maintain his albuterol, Diclofenac Sodium, cetirizine, and amphetamine-dextroamphetamine.   Meds ordered this encounter  Medications  . DISCONTD: amphetamine-dextroamphetamine (ADDERALL XR) 15 MG 24 hr capsule    Sig: Take 1 capsule by mouth every morning.    Dispense:  30 capsule    Refill:  0    Order Specific Question:   Supervising Provider    Answer:   Duncan Dull L [[id number]]  . DISCONTD: amphetamine-dextroamphetamine (ADDERALL XR) 15 MG 24 hr capsule    Sig: Take 1 capsule by mouth every morning.    Dispense:  30 capsule    Refill:  0    Fill on or after 01/18/18    Order Specific Question:   Supervising Provider    Answer:   Duncan Dull L [[id number]]  . amphetamine-dextroamphetamine (ADDERALL XR) 15 MG 24 hr capsule    Sig: Take 1 capsule by mouth every morning.    Dispense:  30 capsule    Refill:  0    Fill on or after 02/17/18    Order Specific Question:   Supervising Provider    Answer:   Sherlene Shams [[id number]]    Return precautions given.   Risks, benefits, and alternatives of the medications and treatment plan prescribed today were discussed, and patient expressed understanding.   Education regarding symptom management and diagnosis given to patient on AVS.  Continue to follow with Allegra Grana, FNP for routine health maintenance.   Pricilla Riffle and I agreed with plan.   Rennie Plowman, FNP

## 2017-12-18 NOTE — Assessment & Plan Note (Signed)
Stable. Continue regimen. I looked up patient on [place] Controlled Substances Reporting System and saw no activity that raised concern of inappropriate use.   

## 2017-12-18 NOTE — Patient Instructions (Signed)
Today we discussed referrals, orders. Physical therapy   I have placed these orders in the system for you.  Please be sure to give [place] a call if you have not heard from our office regarding this. We should hear from [place] within ONE week with information regarding your appointment. If not, please let me know immediately.

## 2017-12-18 NOTE — Assessment & Plan Note (Signed)
Benign exam. Pleased with overall improvement. Reasonable to pursue PT for slight additional benefit.Marland Kitchen Referral placed. Will follow

## 2017-12-18 NOTE — Assessment & Plan Note (Signed)
Dramatically improved. Pending PT

## 2018-01-12 DIAGNOSIS — M25512 Pain in left shoulder: Secondary | ICD-10-CM | POA: Diagnosis not present

## 2018-01-12 DIAGNOSIS — M25612 Stiffness of left shoulder, not elsewhere classified: Secondary | ICD-10-CM | POA: Diagnosis not present

## 2018-05-30 ENCOUNTER — Other Ambulatory Visit: Payer: Self-pay | Admitting: Family

## 2018-05-30 DIAGNOSIS — F988 Other specified behavioral and emotional disorders with onset usually occurring in childhood and adolescence: Secondary | ICD-10-CM

## 2018-06-01 MED ORDER — AMPHETAMINE-DEXTROAMPHET ER 15 MG PO CP24: 15.0000 mg | ORAL_CAPSULE | ORAL | 0 refills | Status: DC

## 2018-06-01 NOTE — Telephone Encounter (Signed)
Call pt I refilled adderall for one month   Please education patient. This is controlled substance;  In order for me to prescribe medication,  Patient must be seen every 3-6 months. please make follow-up appointment.  I looked up patient on [place] Controlled Substances Reporting System and saw no activity that raised concern of inappropriate use.

## 2018-06-01 NOTE — Telephone Encounter (Signed)
I called patient & he scheduled f/u for 06/06/18.

## 2018-06-04 ENCOUNTER — Telehealth: Payer: Self-pay

## 2018-06-04 NOTE — Telephone Encounter (Signed)
I called patient to try to reschedule him for his Wednesday appointment. Logan Schmidt will not be in the office that morning. Patient can be scheduled anytime after 10 am.

## 2018-06-05 ENCOUNTER — Ambulatory Visit: Payer: BLUE CROSS/BLUE SHIELD

## 2018-06-06 ENCOUNTER — Telehealth (INDEPENDENT_AMBULATORY_CARE_PROVIDER_SITE_OTHER): Payer: BLUE CROSS/BLUE SHIELD | Admitting: Family

## 2018-06-06 ENCOUNTER — Ambulatory Visit: Payer: BLUE CROSS/BLUE SHIELD | Admitting: Family

## 2018-06-06 ENCOUNTER — Telehealth: Payer: Self-pay

## 2018-06-06 DIAGNOSIS — F988 Other specified behavioral and emotional disorders with onset usually occurring in childhood and adolescence: Secondary | ICD-10-CM | POA: Diagnosis not present

## 2018-06-06 NOTE — Telephone Encounter (Signed)
Noted  

## 2018-06-06 NOTE — Telephone Encounter (Signed)
Copied from CRM [id number]. Topic: General - Other >> Jun 06, 2018 12:02 PM Jaquita Rector A wrote: Reason for CRM: Patient called back to say he received E-mail about Web visit at 4 pm he will be ready.

## 2018-06-06 NOTE — Progress Notes (Signed)
This visit type was conducted due to national recommendations for restrictions regarding the COVID-19 pandemic (e.g. social distancing).  This format is felt to be most appropriate for this patient at this time.  All issues noted in this document were discussed and addressed.  No physical exam was performed (except for noted visual exam findings with Video Visits).  Virtual Visit via Video Note  I connected with@ on 06/08/18 at  4:00 PM EDT by a video enabled telemedicine application and verified that I am speaking with the correct person using two identifiers.  Location patient: work Environmental education officer Persons participating in the virtual visit: patient, provider  I discussed the limitations of evaluation and management by telemedicine and the availability of in person appointments. The patient expressed understanding and agreed to proceed.   HPI: Patient feels well today, no complaints.  Primarily would like to discuss refill of his Adderall.  He feels well this medication.  He denies any trouble sleeping, increased anxiety, palpitations, CP.  Feel well on the current dose , 15mg  works well for him.  He did have a week when he was off the medication and noticed his concentration was tremendously less.   ROS: See pertinent positives and negatives per HPI.  Past Medical History:  Diagnosis Date  . ADD (attention deficit disorder)    Adderall  . Allergy    Cat, dogs  . Asthma     History reviewed. No pertinent surgical history.  Family History  Problem Relation Age of Onset  . Cancer Mother        breast cancer  . Hyperlipidemia Father   . Hypertension Father   . Asthma Sister   . Heart disease Paternal Grandfather   . Hyperlipidemia Paternal Grandfather     SOCIAL HX: Non-smoker   Current Outpatient Medications:  .  albuterol (PROVENTIL HFA;VENTOLIN HFA) 108 (90 Base) MCG/ACT inhaler, Inhale 1-2 puffs into the lungs every 6 (six) hours as needed for wheezing or  shortness of breath., Disp: 1 Inhaler, Rfl: 2 .  amphetamine-dextroamphetamine (ADDERALL XR) 15 MG 24 hr capsule, Take 1 capsule by mouth every morning., Disp: 30 capsule, Rfl: 0 .  cetirizine (ZYRTEC) 10 MG tablet, Take 10 mg by mouth daily., Disp: , Rfl:  .  Diclofenac Sodium (PENNSAID) 2 % SOLN, Place 2 application onto the skin 2 (two) times daily. (Patient not taking: Reported on 12/18/2017), Disp: 112 g, Rfl: 3  EXAM:  VITALS per patient if applicable:  GENERAL: Appropriately dressed.  Alert, oriented, appears well and in no acute distress  HEENT: atraumatic, conjunttiva clear, no obvious abnormalities on inspection of external nose and ears  NECK: normal movements of the head and neck  LUNGS: on inspection no signs of respiratory distress, breathing rate appears normal, no obvious gross SOB, gasping or wheezing  CV: no obvious cyanosis  MS: moves all visible extremities without noticeable abnormality  PSYCH/NEURO: pleasant and cooperative, no obvious depression or anxiety, speech and thought processing grossly intact  ASSESSMENT AND PLAN:  Discussed the following assessment and plan:  Attention deficit disorder, unspecified hyperactivity presence     I discussed the assessment and treatment plan with the patient. The patient was provided an opportunity to ask questions and all were answered. The patient agreed with the plan and demonstrated an understanding of the instructions.   The patient was advised to call back or seek an in-person evaluation if the symptoms worsen or if the condition fails to improve as anticipated.  I provided 15  minutes of non-face-to-face time during this encounter.   Rennie Plowman, FNP

## 2018-06-08 ENCOUNTER — Encounter: Payer: Self-pay | Admitting: Family

## 2018-06-08 NOTE — Assessment & Plan Note (Signed)
I looked up patient on [place] Controlled Substances Reporting System and saw no activity that raised concern of inappropriate use. Refilled on 06/01/2018.

## 2018-07-23 ENCOUNTER — Other Ambulatory Visit: Payer: Self-pay | Admitting: Family

## 2018-07-23 DIAGNOSIS — F988 Other specified behavioral and emotional disorders with onset usually occurring in childhood and adolescence: Secondary | ICD-10-CM

## 2018-07-25 ENCOUNTER — Other Ambulatory Visit: Payer: Self-pay | Admitting: Lab

## 2018-07-26 NOTE — Telephone Encounter (Signed)
Refilled: 06/01/2018 Last OV: 06/06/2018 Next OV: not scheduled

## 2018-07-27 MED ORDER — AMPHETAMINE-DEXTROAMPHET ER 15 MG PO CP24: 15.0000 mg | ORAL_CAPSULE | ORAL | 0 refills | Status: DC

## 2018-07-27 NOTE — Telephone Encounter (Signed)
I looked up patient on [place] Controlled Substances Reporting System and saw no activity that raised concern of inappropriate use.   

## 2018-09-24 ENCOUNTER — Other Ambulatory Visit: Payer: BLUE CROSS/BLUE SHIELD

## 2018-09-24 ENCOUNTER — Telehealth: Payer: Self-pay

## 2018-09-24 ENCOUNTER — Telehealth: Payer: BLUE CROSS/BLUE SHIELD | Admitting: Physician Assistant

## 2018-09-24 ENCOUNTER — Encounter: Payer: Self-pay | Admitting: Physician Assistant

## 2018-09-24 DIAGNOSIS — Z20822 Contact with and (suspected) exposure to covid-19: Secondary | ICD-10-CM

## 2018-09-24 DIAGNOSIS — R6889 Other general symptoms and signs: Secondary | ICD-10-CM | POA: Diagnosis not present

## 2018-09-24 DIAGNOSIS — R0602 Shortness of breath: Secondary | ICD-10-CM

## 2018-09-24 DIAGNOSIS — R0981 Nasal congestion: Secondary | ICD-10-CM

## 2018-09-24 MED ORDER — ALBUTEROL SULFATE HFA 108 (90 BASE) MCG/ACT IN AERS: 2.0000 | INHALATION_SPRAY | Freq: Four times a day (QID) | RESPIRATORY_TRACT | 0 refills | Status: DC | PRN

## 2018-09-24 NOTE — Progress Notes (Signed)
E-Visit for Corona Virus Screening   Your current symptoms could be consistent with the coronavirus.  Call your health care provider or local health department to request and arrange formal testing. Many health care providers can now test patients at their office but not all are.  Please quarantine yourself while awaiting your test results.  [facility] [phone], [facility] [phone], [facility] [phone] or visit [email or web address].gov/[email or web address]-[email or web address]/testing/[email or web address]  and You have been enrolled in MyChart Home Monitoring for COVID-19.  Daily you will receive a questionnaire within the MyChart website. Our COVID-19 response team will be monitoring your responses daily.    COVID-19 is a respiratory illness with symptoms that are similar to the flu. Symptoms are typically mild to moderate, but there have been cases of severe illness and death due to the virus. The following symptoms may appear 2-14 days after exposure: . Fever . Cough . Shortness of breath or difficulty breathing . Chills . Repeated shaking with chills . Muscle pain . Headache . Sore throat . New loss of taste or smell . Fatigue . Congestion or runny nose . Nausea or vomiting . Diarrhea  It is vitally important that if you feel that you have an infection such as this virus or any other virus that you stay home and away from places where you may spread it to others.  You should self-quarantine for 14 days if you have symptoms that could potentially be coronavirus or have been in close contact a with a person diagnosed with COVID-19 within the last 2 weeks. You should avoid contact with people age 65 and older.   You should wear a mask or cloth face covering over your nose and mouth if you must be around other people or animals, including pets (even at home). Try to stay at least 6 feet away from other people. This will protect the  people around you.  You can use medication such as A prescription inhaler called Albuterol MDI 90 mcg /actuation 2 puffs every 4 hours as needed for shortness of breath, wheezing, cough  You may also take acetaminophen (Tylenol) as needed for fever.   I have also provided a work note  Reduce your risk of any infection by using the same precautions used for avoiding the common cold or flu:  . Wash your hands often with soap and warm water for at least 20 seconds.  If soap and water are not readily available, use an alcohol-based hand sanitizer with at least 60% alcohol.  . If coughing or sneezing, cover your mouth and nose by coughing or sneezing into the elbow areas of your shirt or coat, into a tissue or into your sleeve (not your hands). . Avoid shaking hands with others and consider head nods or verbal greetings only. . Avoid touching your eyes, nose, or mouth with unwashed hands.  . Avoid close contact with people who are sick. . Avoid places or events with large numbers of people in one location, like concerts or sporting events. . Carefully consider travel plans you have or are making. . If you are planning any travel outside or inside the [place], visit the CDC's Travelers' Health webpage for the latest health notices. . If you have some symptoms but not all symptoms, continue to monitor at home and seek medical attention if your symptoms worsen. . If you are having a medical emergency, call 911.  HOME CARE . Only take medications as instructed by your medical team. .   Drink plenty of fluids and get plenty of rest. . A steam or ultrasonic humidifier can help if you have congestion.   GET HELP RIGHT AWAY IF YOU HAVE EMERGENCY WARNING SIGNS** FOR COVID-19. If you or someone is showing any of these signs seek emergency medical care immediately. Call 911 or proceed to your closest emergency facility if: . You develop worsening high fever. . Trouble breathing . Bluish lips or  face . Persistent pain or pressure in the chest . New confusion . Inability to wake or stay awake . You cough up blood. . Your symptoms become more severe  **This list is not all possible symptoms. Contact your medical provider for any symptoms that are sever or concerning to you.   MAKE SURE YOU   Understand these instructions.  Will watch your condition.  Will get help right away if you are not doing well or get worse.  Your e-visit answers were reviewed by a board certified advanced clinical practitioner to complete your personal care plan.  Depending on the condition, your plan could have included both over the counter or prescription medications.  If there is a problem please reply once you have received a response from your provider.  Your safety is important to us.  If you have drug allergies check your prescription carefully.    You can use MyChart to ask questions about today's visit, request a non-urgent call back, or ask for a work or school excuse for 24 hours related to this e-Visit. If it has been greater than 24 hours you will need to follow up with your provider, or enter a new e-Visit to address those concerns. You will get an e-mail in the next two days asking about your experience.  I hope that your e-visit has been valuable and will speed your recovery. Thank you for using e-visits.   I spent 5-10 minutes on review and completion of this note- Tenzin Edelman PAC  

## 2018-09-24 NOTE — Telephone Encounter (Signed)
[place] call from [facility] requesting Covid-19 testing for Patient. Call to patient. Patient scheduled for 09/24/18@ 1:30pm Patient voices understanding.

## 2018-09-28 LAB — NOVEL CORONAVIRUS, NAA: SARS-CoV-2, NAA: NOT DETECTED

## 2018-12-24 ENCOUNTER — Telehealth: Payer: BC Managed Care – PPO | Admitting: Physician Assistant

## 2018-12-24 ENCOUNTER — Other Ambulatory Visit: Payer: Self-pay | Admitting: Family

## 2018-12-24 DIAGNOSIS — F988 Other specified behavioral and emotional disorders with onset usually occurring in childhood and adolescence: Secondary | ICD-10-CM

## 2018-12-24 DIAGNOSIS — R05 Cough: Secondary | ICD-10-CM

## 2018-12-24 DIAGNOSIS — R059 Cough, unspecified: Secondary | ICD-10-CM

## 2018-12-24 MED ORDER — BENZONATATE 100 MG PO CAPS: 100.0000 mg | ORAL_CAPSULE | Freq: Three times a day (TID) | ORAL | 0 refills | Status: DC | PRN

## 2018-12-24 NOTE — Progress Notes (Signed)
Erroneous encounter

## 2018-12-24 NOTE — Progress Notes (Signed)
E-Visit for Corona Virus Screening   Your current symptoms could be consistent with the coronavirus.  Many health care providers can now test patients at their office but not all are.  [place] has multiple testing sites. You do not need an appointment or a lab order.  For information on our COVID testing locations and hours go to [email or web address]  Please quarantine yourself while awaiting your test results.  We are enrolling you in our [place] for [place] . Daily you will receive a questionnaire within the [place] website. Our COVID 19 response team willl be monitoriing your responses daily.    COVID-19 is a respiratory illness with symptoms that are similar to the flu. Symptoms are typically mild to moderate, but there have been cases of severe illness and death due to the virus. The following symptoms may appear 2-14 days after exposure: . Fever . Cough . Shortness of breath or difficulty breathing . Chills . Repeated shaking with chills . Muscle pain . Headache . Sore throat . New loss of taste or smell . Fatigue . Congestion or runny nose . Nausea or vomiting . Diarrhea  It is vitally important that if you feel that you have an infection such as this virus or any other virus that you stay home and away from places where you may spread it to others.  You should self-quarantine for 14 days if you have symptoms that could potentially be coronavirus or have been in close contact a with a person diagnosed with COVID-19 within the last 2 weeks. You should avoid contact with people age 53 and older.   You should wear a mask or cloth face covering over your nose and mouth if you must be around other people or animals, including pets (even at home). Try to stay at least 6 feet away from other people. This will protect the people around you.  You can use medication such as A prescription cough medication called Tessalon Perles 100 mg. You may  take 1-2 capsules every 8 hours as needed for cough  You may also take acetaminophen (Tylenol) as needed for fever. OTC Fluticasone for nasal congestion.   Reduce your risk of any infection by using the same precautions used for avoiding the common cold or flu:  Marland Kitchen Wash your hands often with soap and warm water for at least 20 seconds.  If soap and water are not readily available, use an alcohol-based hand sanitizer with at least 60% alcohol.  . If coughing or sneezing, cover your mouth and nose by coughing or sneezing into the elbow areas of your shirt or coat, into a tissue or into your sleeve (not your hands). . Avoid shaking hands with others and consider head nods or verbal greetings only. . Avoid touching your eyes, nose, or mouth with unwashed hands.  . Avoid close contact with people who are sick. . Avoid places or events with large numbers of people in one location, like concerts or sporting events. . Carefully consider travel plans you have or are making. . If you are planning any travel outside or inside the [place], visit the CDC's Travelers' Health webpage for the latest health notices. . If you have some symptoms but not all symptoms, continue to monitor at home and seek medical attention if your symptoms worsen. . If you are having a medical emergency, call 911.  HOME CARE . Only take medications as instructed by your medical team. . Drink plenty of fluids and get plenty  of rest. . A steam or ultrasonic humidifier can help if you have congestion.   GET HELP RIGHT AWAY IF YOU HAVE EMERGENCY WARNING SIGNS** FOR COVID-19. If you or someone is showing any of these signs seek emergency medical care immediately. Call 911 or proceed to your closest emergency facility if: . You develop worsening high fever. . Trouble breathing . Bluish lips or face . Persistent pain or pressure in the chest . New confusion . Inability to wake or stay awake . You cough up blood. . Your symptoms become  more severe  **This list is not all possible symptoms. Contact your medical provider for any symptoms that are sever or concerning to you.   MAKE SURE YOU   Understand these instructions.  Will watch your condition.  Will get help right away if you are not doing well or get worse.  Your e-visit answers were reviewed by a board certified advanced clinical practitioner to complete your personal care plan.  Depending on the condition, your plan could have included both over the counter or prescription medications.  If there is a problem please reply once you have received a response from your provider.  Your safety is important to [place].  If you have drug allergies check your prescription carefully.    You can use MyChart to ask questions about today's visit, request a non-urgent call back, or ask for a work or school excuse for 24 hours related to this e-Visit. If it has been greater than 24 hours you will need to follow up with your provider, or enter a new e-Visit to address those concerns. You will get an e-mail in the next two days asking about your experience.  I hope that your e-visit has been valuable and will speed your recovery. Thank you for using e-visits.   Greater than 5 minutes, yet less than 10 minutes of time have been spent researching, coordinating and implementing care for this patient today.

## 2018-12-24 NOTE — Telephone Encounter (Signed)
Patient has not been seen in one year. He needs to complete a visit for follow-up prior to receiving a refill. Thanks.

## 2019-01-15 ENCOUNTER — Telehealth: Payer: Self-pay

## 2019-01-15 ENCOUNTER — Other Ambulatory Visit: Payer: Self-pay | Admitting: Family

## 2019-01-15 ENCOUNTER — Encounter: Payer: Self-pay | Admitting: Family

## 2019-01-15 DIAGNOSIS — F988 Other specified behavioral and emotional disorders with onset usually occurring in childhood and adolescence: Secondary | ICD-10-CM

## 2019-01-15 NOTE — Telephone Encounter (Signed)
I called and spoke with patient to schedule appointment so that adderall could be refilled. Appointment scheduled for tomorrow with Ander Purpura.

## 2019-01-16 ENCOUNTER — Other Ambulatory Visit: Payer: Self-pay

## 2019-01-16 ENCOUNTER — Ambulatory Visit (INDEPENDENT_AMBULATORY_CARE_PROVIDER_SITE_OTHER): Payer: BC Managed Care – PPO | Admitting: Family Medicine

## 2019-01-16 ENCOUNTER — Encounter: Payer: Self-pay | Admitting: Family Medicine

## 2019-01-16 DIAGNOSIS — F988 Other specified behavioral and emotional disorders with onset usually occurring in childhood and adolescence: Secondary | ICD-10-CM

## 2019-01-16 MED ORDER — AMPHETAMINE-DEXTROAMPHET ER 15 MG PO CP24: 15.0000 mg | ORAL_CAPSULE | ORAL | 0 refills | Status: DC

## 2019-01-16 NOTE — Progress Notes (Signed)
Patient ID: Logan Schmidt, male   DOB: 11-13-1990, 28 y.o.   MRN: [id number]    Virtual Visit via video Note  This visit type was conducted due to national recommendations for restrictions regarding the COVID-19 pandemic (e.g. social distancing).  This format is felt to be most appropriate for this patient at this time.  All issues noted in this document were discussed and addressed.  No physical exam was performed (except for noted visual exam findings with Video Visits).   I connected with Pricilla Riffle today at  9:20 AM EST by a video enabled telemedicine application or telephone and verified that I am speaking with the correct person using two identifiers. Location patient: home Location provider: work or home office Persons participating in the virtual visit: patient, provider  I discussed the limitations, risks, security and privacy concerns of performing an evaluation and management service by video and the availability of in person appointments. I also discussed with the patient that there may be a patient responsible charge related to this service. The patient expressed understanding and agreed to proceed.   HPI:  Patient and I connected via video to follow-up on Adderall for ADHD.  Patient states he uses mainly during the workweek, does not tend to take on weekends this is why a 30-day supply will usually last him over a month.  Feels good on current dose, feels it helps him to concentrate and be task oriented and productive at work.  He does other strategies to keep himself organized by keeping lists and setting reminders.    ROS: See pertinent positives and negatives per HPI.  Past Medical History:  Diagnosis Date  . ADD (attention deficit disorder)    Adderall  . Allergy    Cat, dogs  . Asthma    History reviewed. No pertinent surgical history.  Family History  Problem Relation Age of Onset  . Cancer Mother        breast cancer  . Hyperlipidemia Father   .  Hypertension Father   . Asthma Sister   . Heart disease Paternal Grandfather   . Hyperlipidemia Paternal Grandfather    Social History   Tobacco Use  . Smoking status: Never Smoker  . Smokeless tobacco: Never Used  Substance Use Topics  . Alcohol use: Yes    Alcohol/week: 10.0 standard drinks    Types: 5 Cans of beer, 5 Shots of liquor per week    Current Outpatient Medications:  .  albuterol (VENTOLIN HFA) 108 (90 Base) MCG/ACT inhaler, Inhale 2 puffs into the lungs every 6 (six) hours as needed for wheezing or shortness of breath., Disp: 6.7 g, Rfl: 0 .  amphetamine-dextroamphetamine (ADDERALL XR) 15 MG 24 hr capsule, Take 1 capsule by mouth every morning., Disp: 30 capsule, Rfl: 0 .  cetirizine (ZYRTEC) 10 MG tablet, Take 10 mg by mouth daily., Disp: , Rfl:   EXAM:  GENERAL: alert, oriented, appears well and in no acute distress  HEENT: atraumatic, conjunttiva clear, no obvious abnormalities on inspection of external nose and ears  NECK: normal movements of the head and neck  LUNGS: on inspection no signs of respiratory distress, breathing rate appears normal, no obvious gross SOB, gasping or wheezing  CV: no obvious cyanosis  MS: moves all visible extremities without noticeable abnormality  PSYCH/NEURO: pleasant and cooperative, no obvious depression or anxiety, speech and thought processing grossly intact  ASSESSMENT AND PLAN:  Discussed the following assessment and plan:  ADHD-Adderall refill sent in.  [place]  PMP registry checked and is appropriate for this refill.  Patient will continue to use during the workweek and take drug holidays on weekends.    I discussed the assessment and treatment plan with the patient. The patient was provided an opportunity to ask questions and all were answered. The patient agreed with the plan and demonstrated an understanding of the instructions.   The patient was advised to call back or seek an in-person evaluation if the  symptoms worsen or if the condition fails to improve as anticipated.   Jodelle Green, FNP

## 2019-02-12 ENCOUNTER — Telehealth: Payer: BC Managed Care – PPO | Admitting: Nurse Practitioner

## 2019-02-12 DIAGNOSIS — Z20822 Contact with and (suspected) exposure to covid-19: Secondary | ICD-10-CM

## 2019-02-12 DIAGNOSIS — Z20828 Contact with and (suspected) exposure to other viral communicable diseases: Secondary | ICD-10-CM

## 2019-02-12 DIAGNOSIS — R05 Cough: Secondary | ICD-10-CM

## 2019-02-12 DIAGNOSIS — R059 Cough, unspecified: Secondary | ICD-10-CM

## 2019-02-12 DIAGNOSIS — R0981 Nasal congestion: Secondary | ICD-10-CM

## 2019-02-12 MED ORDER — BENZONATATE 100 MG PO CAPS: 100.0000 mg | ORAL_CAPSULE | Freq: Three times a day (TID) | ORAL | 0 refills | Status: DC | PRN

## 2019-02-12 NOTE — Progress Notes (Signed)
E-Visit for Corona Virus Screening   Your current symptoms could be consistent with the coronavirus.  Many health care providers can now test patients at their office but not all are.  [facility] has multiple testing sites. For information on our COVID testing locations and hours go to [email or web address]  Please quarantine yourself while awaiting your test results.  We are enrolling you in our MyChart Home [facility] for COVID19 . Daily you will receive a questionnaire within the MyChart website. Our COVID 19 response team willl be monitoriing your responses daily. Please continue good preventive care measures, including:  frequent hand-washing, avoid touching your face, cover coughs/sneezes, stay out of crowds and keep a 6 foot distance from others.    COVID-19 is a respiratory illness with symptoms that are similar to the flu. Symptoms are typically mild to moderate, but there have been cases of severe illness and death due to the virus. The following symptoms may appear 2-14 days after exposure: . Fever . Cough . Shortness of breath or difficulty breathing . Chills . Repeated shaking with chills . Muscle pain . Headache . Sore throat . New loss of taste or smell . Fatigue . Congestion or runny nose . Nausea or vomiting . Diarrhea  If you develop fever/cough/breathlessness, please stay home for 10 days with improving symptoms and until you have had 24 hours of no fever (without taking a fever reducer).  Go to the nearest hospital ED for assessment if fever/cough/breathlessness are severe or illness seems like a threat to life.  It is vitally important that if you feel that you have an infection such as this virus or any other virus that you stay home and away from places where you may spread it to others.  You should avoid contact with people age 58 and older.   You can go to one of the  testing sites listed below, while they are opened (see hours). You do not  need a doctors order to be tested for covid.You do need to self-isolate until your results return and if positive 14 days from when your symptoms started and until you are 3 days symptom free.   Testing Locations (Monday - Friday, 10a.m. - 3:30 p.m.)   [place]: [facility] (Visitor Entrance), [address] [place], [place] -   [place]: [employer] [place] [address], [address], [place], [place] (entrance off [employer]) -   [place] (Closed each Monday): [address] [place], [place] - the short stay covered drive at [facility] (Use the [employer] entrance to [facility] next to [facility].) -    You should wear a mask or cloth face covering over your nose and mouth if you must be around other people or animals, including pets (even at home). Try to stay at least 6 feet away from other people. This will protect the people around you.  You can use medication such as A prescription cough medication called Tessalon Perles 100 mg. You may take 1-2 capsules every 8 hours as needed for cough  You may also take acetaminophen (Tylenol) as needed for fever.   Reduce your risk of any infection by using the same precautions used for avoiding the common cold or flu:  Marland Kitchen Wash your hands often with soap and warm water for at least 20 seconds.  If soap and water are not readily available, use an alcohol-based hand sanitizer with at least 60% alcohol.  Marland Kitchen  If coughing or sneezing, cover your mouth and nose by coughing or sneezing into the elbow areas of your shirt or coat, into a tissue or into your sleeve (not your hands). . Avoid shaking hands with others and consider head nods or verbal greetings only. . Avoid touching your eyes, nose, or mouth with unwashed hands.  . Avoid close contact with people who are sick. . Avoid places or events with large numbers of people in one location, like concerts or  sporting events. . Carefully consider travel plans you have or are making. . If you are planning any travel outside or inside the [place], visit the CDC's Travelers' Health webpage for the latest health notices. . If you have some symptoms but not all symptoms, continue to monitor at home and seek medical attention if your symptoms worsen. . If you are having a medical emergency, call 911.  HOME CARE . Only take medications as instructed by your medical team. . Drink plenty of fluids and get plenty of rest. . A steam or ultrasonic humidifier can help if you have congestion.   GET HELP RIGHT AWAY IF YOU HAVE EMERGENCY WARNING SIGNS** FOR COVID-19. If you or someone is showing any of these signs seek emergency medical care immediately. Call 911 or proceed to your closest emergency facility if: . You develop worsening high fever. . Trouble breathing . Bluish lips or face . Persistent pain or pressure in the chest . New confusion . Inability to wake or stay awake . You cough up blood. . Your symptoms become more severe  **This list is not all possible symptoms. Contact your medical provider for any symptoms that are sever or concerning to you.   MAKE SURE YOU   Understand these instructions.  Will watch your condition.  Will get help right away if you are not doing well or get worse.  Your e-visit answers were reviewed by a board certified advanced clinical practitioner to complete your personal care plan.  Depending on the condition, your plan could have included both over the counter or prescription medications.  If there is a problem please reply once you have received a response from your provider.  Your safety is important to [place].  If you have drug allergies check your prescription carefully.    You can use MyChart to ask questions about today's visit, request a non-urgent call back, or ask for a work or school excuse for 24 hours related to this e-Visit. If it has been greater than 24  hours you will need to follow up with your provider, or enter a new e-Visit to address those concerns. You will get an e-mail in the next two days asking about your experience.  I hope that your e-visit has been valuable and will speed your recovery. Thank you for using e-visits.   5-10 minutes spent reviewing and documenting in chart.

## 2019-02-13 ENCOUNTER — Other Ambulatory Visit: Payer: Self-pay

## 2019-02-13 DIAGNOSIS — Z20822 Contact with and (suspected) exposure to covid-19: Secondary | ICD-10-CM

## 2019-02-19 LAB — NOVEL CORONAVIRUS, NAA: SARS-CoV-2, NAA: NOT DETECTED

## 2019-02-20 DIAGNOSIS — S60811A Abrasion of right wrist, initial encounter: Secondary | ICD-10-CM | POA: Diagnosis not present

## 2019-02-20 DIAGNOSIS — M25511 Pain in right shoulder: Secondary | ICD-10-CM | POA: Diagnosis not present

## 2019-02-20 DIAGNOSIS — S60512A Abrasion of left hand, initial encounter: Secondary | ICD-10-CM | POA: Diagnosis not present

## 2019-02-20 DIAGNOSIS — F17200 Nicotine dependence, unspecified, uncomplicated: Secondary | ICD-10-CM | POA: Diagnosis not present

## 2019-02-20 DIAGNOSIS — Y9351 Activity, roller skating (inline) and skateboarding: Secondary | ICD-10-CM | POA: Diagnosis not present

## 2019-02-20 DIAGNOSIS — S40211A Abrasion of right shoulder, initial encounter: Secondary | ICD-10-CM | POA: Diagnosis not present

## 2019-02-20 DIAGNOSIS — Z880 Allergy status to penicillin: Secondary | ICD-10-CM | POA: Diagnosis not present

## 2019-02-20 DIAGNOSIS — T1490XA Injury, unspecified, initial encounter: Secondary | ICD-10-CM | POA: Diagnosis not present

## 2019-02-20 DIAGNOSIS — S4351XA Sprain of right acromioclavicular joint, initial encounter: Secondary | ICD-10-CM | POA: Diagnosis not present

## 2019-02-20 DIAGNOSIS — Z23 Encounter for immunization: Secondary | ICD-10-CM | POA: Diagnosis not present

## 2019-03-12 DIAGNOSIS — M25511 Pain in right shoulder: Secondary | ICD-10-CM | POA: Diagnosis not present

## 2019-03-14 DIAGNOSIS — Z20828 Contact with and (suspected) exposure to other viral communicable diseases: Secondary | ICD-10-CM | POA: Diagnosis not present

## 2019-03-18 ENCOUNTER — Encounter: Payer: Self-pay | Admitting: Family

## 2019-03-22 DIAGNOSIS — M25511 Pain in right shoulder: Secondary | ICD-10-CM | POA: Diagnosis not present

## 2019-03-29 ENCOUNTER — Ambulatory Visit (INDEPENDENT_AMBULATORY_CARE_PROVIDER_SITE_OTHER): Payer: BC Managed Care – PPO | Admitting: Family

## 2019-03-29 ENCOUNTER — Other Ambulatory Visit: Payer: Self-pay

## 2019-03-29 ENCOUNTER — Encounter: Payer: Self-pay | Admitting: Family

## 2019-03-29 DIAGNOSIS — J45909 Unspecified asthma, uncomplicated: Secondary | ICD-10-CM | POA: Diagnosis not present

## 2019-03-29 DIAGNOSIS — M25511 Pain in right shoulder: Secondary | ICD-10-CM | POA: Diagnosis not present

## 2019-03-29 DIAGNOSIS — F988 Other specified behavioral and emotional disorders with onset usually occurring in childhood and adolescence: Secondary | ICD-10-CM | POA: Diagnosis not present

## 2019-03-29 MED ORDER — AMPHETAMINE-DEXTROAMPHET ER 15 MG PO CP24: 15.0000 mg | ORAL_CAPSULE | ORAL | 0 refills | Status: DC

## 2019-03-29 NOTE — Assessment & Plan Note (Signed)
Improving with physical therapy.  He is also following with orthopedics, [facility].

## 2019-03-29 NOTE — Progress Notes (Signed)
Virtual Visit via Video Note  I connected with@  on 03/29/19 at 11:30 AM EST by a video enabled telemedicine application and verified that I am speaking with the correct person using two identifiers.  Location patient: home Location provider:work  Persons participating in the virtual visit: patient, provider  I discussed the limitations of evaluation and management by telemedicine and the availability of in person appointments. The patient expressed understanding and agreed to proceed.   HPI: Follow-up for medication refill Doing well today, no concerns  Asthma- doing well.  No wheezing or cough.  Has albuterol for as needed use.  ADD-doing well on current dose of Adderall.  No trouble sleeping, palpitations or increased anxiety.  Would like refill  Right shoulder pain-improved. Doing PT now.  suspected AC separation Seeing [facility] Dr Dierdre Searles.   ROS: See pertinent positives and negatives per HPI.  Past Medical History:  Diagnosis Date  . ADD (attention deficit disorder)    Adderall  . Allergy    Cat, dogs  . Asthma     No past surgical history on file.  Family History  Problem Relation Age of Onset  . Cancer Mother        breast cancer  . Hyperlipidemia Father   . Hypertension Father   . Asthma Sister   . Heart disease Paternal Grandfather   . Hyperlipidemia Paternal Grandfather     SOCIAL HX: non smoker  Current Outpatient Medications:  .  albuterol (VENTOLIN HFA) 108 (90 Base) MCG/ACT inhaler, Inhale 2 puffs into the lungs every 6 (six) hours as needed for wheezing or shortness of breath., Disp: 6.7 g, Rfl: 0 .  amphetamine-dextroamphetamine (ADDERALL XR) 15 MG 24 hr capsule, Take 1 capsule by mouth every morning., Disp: 30 capsule, Rfl: 0 .  cetirizine (ZYRTEC) 10 MG tablet, Take 10 mg by mouth daily., Disp: , Rfl:   EXAM:  VITALS per patient if applicable:  GENERAL: alert, oriented, appears well and in no acute distress  HEENT: atraumatic, conjunttiva clear,  no obvious abnormalities on inspection of external nose and ears  NECK: normal movements of the head and neck  LUNGS: on inspection no signs of respiratory distress, breathing rate appears normal, no obvious gross SOB, gasping or wheezing  CV: no obvious cyanosis  MS: moves all visible extremities without noticeable abnormality  PSYCH/NEURO: pleasant and cooperative, no obvious depression or anxiety, speech and thought processing grossly intact  ASSESSMENT AND PLAN:  Discussed the following assessment and plan:  Attention deficit disorder, unspecified hyperactivity presence - Plan: amphetamine-dextroamphetamine (ADDERALL XR) 15 MG 24 hr capsule, DISCONTINUED: amphetamine-dextroamphetamine (ADDERALL XR) 15 MG 24 hr capsule, DISCONTINUED: amphetamine-dextroamphetamine (ADDERALL XR) 15 MG 24 hr capsule  Uncomplicated asthma, unspecified asthma severity, unspecified whether persistent  Acute pain of right shoulder Problem List Items Addressed This Visit      Respiratory   Asthma    Asymptomatic.  Continue regimen        Other   ADD (attention deficit disorder)    Doing well on current regimen, will continue. Refilled   I looked up patient on [place] Controlled Substances Reporting System and saw no activity that raised concern of inappropriate use.        Relevant Medications   amphetamine-dextroamphetamine (ADDERALL XR) 15 MG 24 hr capsule   Right shoulder pain    Improving with physical therapy.  He is also following with orthopedics, [facility].         -we discussed possible serious and likely etiologies, options  for evaluation and workup, limitations of telemedicine visit vs in person visit, treatment, treatment risks and precautions. Pt prefers to treat via telemedicine empirically rather then risking or undertaking an in person visit at this moment. Patient agrees to seek prompt in person care if worsening, new symptoms arise, or if is not improving with treatment.   I  discussed the assessment and treatment plan with the patient. The patient was provided an opportunity to ask questions and all were answered. The patient agreed with the plan and demonstrated an understanding of the instructions.   The patient was advised to call back or seek an in-person evaluation if the symptoms worsen or if the condition fails to improve as anticipated.   Mable Paris, FNP

## 2019-03-29 NOTE — Assessment & Plan Note (Signed)
Doing well on current regimen, will continue. Refilled   I looked up patient on [place] Controlled Substances Reporting System and saw no activity that raised concern of inappropriate use.

## 2019-03-29 NOTE — Assessment & Plan Note (Signed)
Asymptomatic.  Continue regimen

## 2019-04-05 DIAGNOSIS — M25511 Pain in right shoulder: Secondary | ICD-10-CM | POA: Diagnosis not present

## 2019-04-12 DIAGNOSIS — M25511 Pain in right shoulder: Secondary | ICD-10-CM | POA: Diagnosis not present

## 2019-04-19 DIAGNOSIS — M25511 Pain in right shoulder: Secondary | ICD-10-CM | POA: Diagnosis not present

## 2019-05-08 ENCOUNTER — Encounter: Payer: Self-pay | Admitting: Family

## 2019-05-08 ENCOUNTER — Other Ambulatory Visit: Payer: Self-pay | Admitting: Family

## 2019-05-08 DIAGNOSIS — F988 Other specified behavioral and emotional disorders with onset usually occurring in childhood and adolescence: Secondary | ICD-10-CM

## 2019-05-08 NOTE — Telephone Encounter (Signed)
Refill request for adderall, last seen & last filled 03-29-19.  Please advise.

## 2019-05-12 NOTE — Telephone Encounter (Signed)
Again, confused, looks like I wrote one to fill 05/27/19?  Did I not send 3 separate scripts to pharmacy?  Would you call pharmacy here?

## 2019-05-16 NOTE — Telephone Encounter (Signed)
I called patient but was unable to reach due to no VM being set up.

## 2019-07-01 ENCOUNTER — Encounter: Payer: Self-pay | Admitting: Family

## 2019-07-01 ENCOUNTER — Telehealth (INDEPENDENT_AMBULATORY_CARE_PROVIDER_SITE_OTHER): Payer: BC Managed Care – PPO | Admitting: Family

## 2019-07-01 DIAGNOSIS — F988 Other specified behavioral and emotional disorders with onset usually occurring in childhood and adolescence: Secondary | ICD-10-CM | POA: Diagnosis not present

## 2019-07-01 NOTE — Assessment & Plan Note (Signed)
Stable on adderall. Awaiting formal records from [facility] in regards to ADHD diagnosis since under media tab. Patient understands that if we are unable to located these records that he will need to be retested. Advised in person exam this calendar year so we can assess vitals, obtain labs. Follow up in 3 months.

## 2019-07-01 NOTE — Patient Instructions (Signed)
Nice to see you.  We will work on obtaining records for [employer].   Please make in person appointment this calendar year so we can obtain vitals, labs.

## 2019-07-01 NOTE — Progress Notes (Signed)
Virtual Visit via Video Note  I connected with@  on 07/01/19 at 10:30 AM EDT by a video enabled telemedicine application and verified that I am speaking with the correct person using two identifiers.  Location patient: home Location provider:work  Persons participating in the virtual visit: patient, provider  I discussed the limitations of evaluation and management by telemedicine and the availability of in person appointments. The patient expressed understanding and agreed to proceed.   HPI: Feels well.  No complaints today Doing well on Adderall 10 release.  No palpitations, increased anxiety or trouble sleeping, cp.   Started adderall in highschool, early college.  Diagnosed with ADHD from [facility]. Unsure of exact date. originally started on vyvanse as child.      ROS: See pertinent positives and negatives per HPI.  Past Medical History:  Diagnosis Date  . ADD (attention deficit disorder)    Adderall  . Allergy    Cat, dogs  . Asthma     No past surgical history on file.  Family History  Problem Relation Age of Onset  . Cancer Mother        breast cancer  . Hyperlipidemia Father   . Hypertension Father   . Asthma Sister   . Heart disease Paternal Grandfather   . Hyperlipidemia Paternal Grandfather       Current Outpatient Medications:  .  albuterol (VENTOLIN HFA) 108 (90 Base) MCG/ACT inhaler, Inhale 2 puffs into the lungs every 6 (six) hours as needed for wheezing or shortness of breath., Disp: 6.7 g, Rfl: 0 .  amphetamine-dextroamphetamine (ADDERALL XR) 15 MG 24 hr capsule, Take 1 capsule by mouth every morning., Disp: 30 capsule, Rfl: 0 .  cetirizine (ZYRTEC) 10 MG tablet, Take 10 mg by mouth daily., Disp: , Rfl:   EXAM:  VITALS per patient if applicable:  GENERAL: alert, oriented, appears well and in no acute distress  HEENT: atraumatic, conjunttiva clear, no obvious abnormalities on inspection of external nose and ears  NECK: normal  movements of the head and neck  LUNGS: on inspection no signs of respiratory distress, breathing rate appears normal, no obvious gross SOB, gasping or wheezing  CV: no obvious cyanosis  MS: moves all visible extremities without noticeable abnormality  PSYCH/NEURO: pleasant and cooperative, no obvious depression or anxiety, speech and thought processing grossly intact  ASSESSMENT AND PLAN:  Discussed the following assessment and plan:  Attention deficit disorder, unspecified hyperactivity presence Problem List Items Addressed This Visit      Other   ADD (attention deficit disorder)    Stable on adderall. Awaiting formal records from [facility] in regards to ADHD diagnosis since under media tab. Patient understands that if we are unable to located these records that he will need to be retested. Advised in person exam this calendar year so we can assess vitals, obtain labs. Follow up in 3 months.          -we discussed possible serious and likely etiologies, options for evaluation and workup, limitations of telemedicine visit vs in person visit, treatment, treatment risks and precautions. Pt prefers to treat via telemedicine empirically rather then risking or undertaking an in person visit at this moment. Patient agrees to seek prompt in person care if worsening, new symptoms arise, or if is not improving with treatment.   I discussed the assessment and treatment plan with the patient. The patient was provided an opportunity to ask questions and all were answered. The patient agreed with the plan and  demonstrated an understanding of the instructions.   The patient was advised to call back or seek an in-person evaluation if the symptoms worsen or if the condition fails to improve as anticipated.   Mable Paris, FNP

## 2019-08-06 DIAGNOSIS — F4322 Adjustment disorder with anxiety: Secondary | ICD-10-CM | POA: Diagnosis not present

## 2019-08-13 DIAGNOSIS — F4322 Adjustment disorder with anxiety: Secondary | ICD-10-CM | POA: Diagnosis not present

## 2019-08-26 ENCOUNTER — Other Ambulatory Visit: Payer: Self-pay | Admitting: Family

## 2019-08-26 DIAGNOSIS — F988 Other specified behavioral and emotional disorders with onset usually occurring in childhood and adolescence: Secondary | ICD-10-CM

## 2019-08-27 DIAGNOSIS — F4322 Adjustment disorder with anxiety: Secondary | ICD-10-CM | POA: Diagnosis not present

## 2019-08-30 ENCOUNTER — Other Ambulatory Visit: Payer: Self-pay | Admitting: Family

## 2019-08-30 DIAGNOSIS — F988 Other specified behavioral and emotional disorders with onset usually occurring in childhood and adolescence: Secondary | ICD-10-CM

## 2019-08-30 MED ORDER — AMPHETAMINE-DEXTROAMPHET ER 15 MG PO CP24: 15.0000 mg | ORAL_CAPSULE | ORAL | 0 refills | Status: DC

## 2019-08-30 NOTE — Telephone Encounter (Signed)
Call pt Please coordinate and send him med release form as he is working on providing me with records of FORMAL diagnosis of adhd so we can scan to chart

## 2019-08-30 NOTE — Progress Notes (Signed)
I looked up patient on [place] Controlled Substances Reporting System PMP AWARE and saw no activity that raised concern of inappropriate use.   

## 2019-09-03 DIAGNOSIS — F4322 Adjustment disorder with anxiety: Secondary | ICD-10-CM | POA: Diagnosis not present

## 2019-09-06 NOTE — Telephone Encounter (Signed)
No answer,  No voicemail 

## 2019-09-06 NOTE — Telephone Encounter (Signed)
Will attempt to call patient again on Monday to discuss ROI form. Placed in front desk for patient to come in and sign.

## 2019-09-18 DIAGNOSIS — F4322 Adjustment disorder with anxiety: Secondary | ICD-10-CM | POA: Diagnosis not present

## 2019-09-25 DIAGNOSIS — F4322 Adjustment disorder with anxiety: Secondary | ICD-10-CM | POA: Diagnosis not present

## 2019-10-01 ENCOUNTER — Other Ambulatory Visit: Payer: Self-pay

## 2019-10-01 ENCOUNTER — Ambulatory Visit: Payer: BC Managed Care – PPO | Admitting: Family

## 2019-10-01 ENCOUNTER — Encounter: Payer: Self-pay | Admitting: Family

## 2019-10-01 VITALS — BP 118/64 | HR 60 | Temp 97.8°F | Resp 14 | Ht 74.0 in | Wt 182.2 lb

## 2019-10-01 DIAGNOSIS — F988 Other specified behavioral and emotional disorders with onset usually occurring in childhood and adolescence: Secondary | ICD-10-CM

## 2019-10-01 LAB — CBC WITH DIFFERENTIAL/PLATELET
Basophils Absolute: 0 10*3/uL (ref 0.0–0.1)
Basophils Relative: 1 % (ref 0.0–3.0)
Eosinophils Absolute: 0.3 10*3/uL (ref 0.0–0.7)
Eosinophils Relative: 7.7 % — ABNORMAL HIGH (ref 0.0–5.0)
HCT: 41.9 % (ref 39.0–52.0)
Hemoglobin: 14.1 g/dL (ref 13.0–17.0)
Lymphocytes Relative: 27.9 % (ref 12.0–46.0)
Lymphs Abs: 1.2 10*3/uL (ref 0.7–4.0)
MCHC: 33.6 g/dL (ref 30.0–36.0)
MCV: 90.8 fl (ref 78.0–100.0)
Monocytes Absolute: 0.3 10*3/uL (ref 0.1–1.0)
Monocytes Relative: 6.3 % (ref 3.0–12.0)
Neutro Abs: 2.6 10*3/uL (ref 1.4–7.7)
Neutrophils Relative %: 57.1 % (ref 43.0–77.0)
Platelets: 129 10*3/uL — ABNORMAL LOW (ref 150.0–400.0)
RBC: 4.61 Mil/uL (ref 4.22–5.81)
RDW: 13.2 % (ref 11.5–15.5)
WBC: 4.5 10*3/uL (ref 4.0–10.5)

## 2019-10-01 LAB — COMPREHENSIVE METABOLIC PANEL
ALT: 11 U/L (ref 0–53)
AST: 15 U/L (ref 0–37)
Albumin: 4.4 g/dL (ref 3.5–5.2)
Alkaline Phosphatase: 58 U/L (ref 39–117)
BUN: 13 mg/dL (ref 6–23)
CO2: 28 mEq/L (ref 19–32)
Calcium: 9.1 mg/dL (ref 8.4–10.5)
Chloride: 105 mEq/L (ref 96–112)
Creatinine, Ser: 1.06 mg/dL (ref 0.40–1.50)
GFR: 81.69 mL/min (ref >60.00)
Glucose, Bld: 105 mg/dL — ABNORMAL HIGH (ref 70–99)
Potassium: 4.1 mEq/L (ref 3.5–5.1)
Sodium: 139 mEq/L (ref 135–145)
Total Bilirubin: 0.7 mg/dL (ref 0.2–1.2)
Total Protein: 7.3 g/dL (ref 6.0–8.3)

## 2019-10-01 LAB — LIPID PANEL
Cholesterol: 131 mg/dL (ref 0–200)
HDL: 62.4 mg/dL (ref >39.00)
LDL Cholesterol: 54 mg/dL (ref 0–99)
NonHDL: 68.66
Total CHOL/HDL Ratio: 2
Triglycerides: 74 mg/dL (ref 0.0–149.0)
VLDL: 14.8 mg/dL (ref 0.0–40.0)

## 2019-10-01 LAB — TSH: TSH: 1.15 u[IU]/mL (ref 0.35–4.50)

## 2019-10-01 LAB — HEMOGLOBIN A1C: Hgb A1c MFr Bld: 5.3 % (ref 4.6–6.5)

## 2019-10-01 LAB — VITAMIN D 25 HYDROXY (VIT D DEFICIENCY, FRACTURES): VITD: 24.18 ng/mL — ABNORMAL LOW (ref 30.00–100.00)

## 2019-10-01 NOTE — Patient Instructions (Signed)
Nice to see you!   

## 2019-10-01 NOTE — Assessment & Plan Note (Signed)
New CSC signed, and medical release form faxed to pediatrician for formal diagnosis of ADHD.  Doing well on adderall and will continue.

## 2019-10-01 NOTE — Progress Notes (Signed)
Subjective:    Patient ID: Logan Schmidt, male    DOB: Oct 10, 1990, 29 y.o.   MRN: [id number]  CC: Logan Schmidt is a 29 y.o. male who presents today for follow up.   HPI: Feels well today No complaints. No fatigue.  Doing well on adderall 15mg . No increased anxiety. No palpitations nor trouble sleeping Had been diagnosed by [place]ian in [place]  Patient desires full panel of screening labs today as he had done in 2017.   HISTORY:  Past Medical History:  Diagnosis Date  . ADD (attention deficit disorder)    Adderall  . Allergy    Cat, dogs  . Asthma    No past surgical history on file. Family History  Problem Relation Age of Onset  . Cancer Mother        breast cancer  . Hyperlipidemia Father   . Hypertension Father   . Asthma Sister   . Heart disease Paternal Grandfather   . Hyperlipidemia Paternal Grandfather     Allergies: Penicillins Current Outpatient Medications on File Prior to Visit  Medication Sig Dispense Refill  . albuterol (VENTOLIN HFA) 108 (90 Base) MCG/ACT inhaler Inhale 2 puffs into the lungs every 6 (six) hours as needed for wheezing or shortness of breath. 6.7 g 0  . amphetamine-dextroamphetamine (ADDERALL XR) 15 MG 24 hr capsule Take 1 capsule by mouth every morning. 30 capsule 0  . cetirizine (ZYRTEC) 10 MG tablet Take 10 mg by mouth daily.     No current facility-administered medications on file prior to visit.    Social History   Tobacco Use  . Smoking status: Never Smoker  . Smokeless tobacco: Never Used  Substance Use Topics  . Alcohol use: Yes    Alcohol/week: 10.0 standard drinks    Types: 5 Cans of beer, 5 Shots of liquor per week  . Drug use: No    Review of Systems  Constitutional: Negative for chills and fever.  Respiratory: Negative for cough.   Cardiovascular: Negative for chest pain and palpitations.  Gastrointestinal: Negative for nausea and vomiting.      Objective:    BP 118/64 (BP Location: Left Arm,  Patient Position: Sitting, Cuff Size: Normal)   Pulse 60   Temp 97.8 F (36.6 C) (Oral)   Resp 14   Ht 6\' 2"  (1.88 m)   Wt 182 lb 3.2 oz (82.6 kg)   SpO2 99%   BMI 23.39 kg/m  BP Readings from Last 3 Encounters:  10/01/19 118/64  12/18/17 140/84  12/27/16 128/84   Wt Readings from Last 3 Encounters:  10/01/19 182 lb 3.2 oz (82.6 kg)  07/01/19 185 lb (83.9 kg)  03/29/19 190 lb (86.2 kg)    Physical Exam Vitals reviewed.  Constitutional:      Appearance: He is well-developed.  Cardiovascular:     Rate and Rhythm: Regular rhythm.     Heart sounds: Normal heart sounds.  Pulmonary:     Effort: Pulmonary effort is normal. No respiratory distress.     Breath sounds: Normal breath sounds. No wheezing, rhonchi or rales.  Skin:    General: Skin is warm and dry.  Neurological:     Mental Status: He is alert.  Psychiatric:        Speech: Speech normal.        Behavior: Behavior normal.        Assessment & Plan:   Problem List Items Addressed This Visit      Other  ADD (attention deficit disorder) - Primary    New CSC signed, and medical release form faxed to pediatrician for formal diagnosis of ADHD.  Doing well on adderall and will continue.       Relevant Orders   TSH   CBC with Differential/Platelet   Comprehensive metabolic panel   Hemoglobin A1c   Lipid panel   VITAMIN D 25 Hydroxy (Vit-D Deficiency, Fractures)       I am having Alexy Heldt maintain his cetirizine, albuterol, and amphetamine-dextroamphetamine.   No orders of the defined types were placed in this encounter.   Return precautions given.   Risks, benefits, and alternatives of the medications and treatment plan prescribed today were discussed, and patient expressed understanding.   Education regarding symptom management and diagnosis given to patient on AVS.  Continue to follow with Allegra Grana, FNP for routine health maintenance.   Pricilla Riffle and I agreed with plan.    Rennie Plowman, FNP

## 2019-10-02 ENCOUNTER — Other Ambulatory Visit: Payer: Self-pay | Admitting: Family

## 2019-10-02 DIAGNOSIS — D7219 Other eosinophilia: Secondary | ICD-10-CM

## 2019-10-09 DIAGNOSIS — F4322 Adjustment disorder with anxiety: Secondary | ICD-10-CM | POA: Diagnosis not present

## 2019-10-23 DIAGNOSIS — F4322 Adjustment disorder with anxiety: Secondary | ICD-10-CM | POA: Diagnosis not present

## 2019-11-06 DIAGNOSIS — F4322 Adjustment disorder with anxiety: Secondary | ICD-10-CM | POA: Diagnosis not present

## 2019-11-13 DIAGNOSIS — F4322 Adjustment disorder with anxiety: Secondary | ICD-10-CM | POA: Diagnosis not present

## 2019-11-20 DIAGNOSIS — F4322 Adjustment disorder with anxiety: Secondary | ICD-10-CM | POA: Diagnosis not present

## 2019-12-11 ENCOUNTER — Telehealth: Payer: Self-pay | Admitting: Family

## 2019-12-11 DIAGNOSIS — F988 Other specified behavioral and emotional disorders with onset usually occurring in childhood and adolescence: Secondary | ICD-10-CM

## 2019-12-11 NOTE — Telephone Encounter (Signed)
Patient called & made aware that records could not be accessed & that referral was placed. He knows to call our office if he does not hear about referral.

## 2019-12-11 NOTE — Telephone Encounter (Signed)
Call pt We rec'd note from [facility] peds that they cannot get records from before 2 EMR upgrades He will need to get formally tested again for adhd  I can continue medications however this is policy I have placed referral to  [facility]

## 2019-12-12 NOTE — Telephone Encounter (Signed)
err

## 2019-12-26 DIAGNOSIS — F900 Attention-deficit hyperactivity disorder, predominantly inattentive type: Secondary | ICD-10-CM | POA: Diagnosis not present

## 2019-12-26 DIAGNOSIS — Z79899 Other long term (current) drug therapy: Secondary | ICD-10-CM | POA: Diagnosis not present

## 2019-12-26 DIAGNOSIS — Z1389 Encounter for screening for other disorder: Secondary | ICD-10-CM | POA: Diagnosis not present

## 2020-01-01 ENCOUNTER — Ambulatory Visit: Payer: BC Managed Care – PPO | Admitting: Family

## 2020-01-29 ENCOUNTER — Other Ambulatory Visit: Payer: Self-pay

## 2020-02-03 ENCOUNTER — Encounter: Payer: Self-pay | Admitting: Family

## 2020-02-03 ENCOUNTER — Ambulatory Visit: Payer: BC Managed Care – PPO | Admitting: Family

## 2020-02-03 ENCOUNTER — Other Ambulatory Visit: Payer: Self-pay

## 2020-02-03 VITALS — BP 122/80 | HR 85 | Temp 98.3°F | Ht 74.0 in | Wt 184.8 lb

## 2020-02-03 DIAGNOSIS — D7219 Other eosinophilia: Secondary | ICD-10-CM | POA: Diagnosis not present

## 2020-02-03 DIAGNOSIS — D721 Eosinophilia, unspecified: Secondary | ICD-10-CM | POA: Diagnosis not present

## 2020-02-03 DIAGNOSIS — F988 Other specified behavioral and emotional disorders with onset usually occurring in childhood and adolescence: Secondary | ICD-10-CM

## 2020-02-03 DIAGNOSIS — E559 Vitamin D deficiency, unspecified: Secondary | ICD-10-CM

## 2020-02-03 LAB — CBC WITH DIFFERENTIAL/PLATELET
Basophils Absolute: 0 10*3/uL (ref 0.0–0.1)
Basophils Relative: 0.6 % (ref 0.0–3.0)
Eosinophils Absolute: 0.3 10*3/uL (ref 0.0–0.7)
Eosinophils Relative: 7.1 % — ABNORMAL HIGH (ref 0.0–5.0)
HCT: 40.6 % (ref 39.0–52.0)
Hemoglobin: 13.8 g/dL (ref 13.0–17.0)
Lymphocytes Relative: 22.6 % (ref 12.0–46.0)
Lymphs Abs: 1 10*3/uL (ref 0.7–4.0)
MCHC: 33.9 g/dL (ref 30.0–36.0)
MCV: 89.8 fl (ref 78.0–100.0)
Monocytes Absolute: 0.4 10*3/uL (ref 0.1–1.0)
Monocytes Relative: 7.8 % (ref 3.0–12.0)
Neutro Abs: 2.9 10*3/uL (ref 1.4–7.7)
Neutrophils Relative %: 61.9 % (ref 43.0–77.0)
Platelets: 117 10*3/uL — ABNORMAL LOW (ref 150.0–400.0)
RBC: 4.52 Mil/uL (ref 4.22–5.81)
RDW: 12.8 % (ref 11.5–15.5)
WBC: 4.6 10*3/uL (ref 4.0–10.5)

## 2020-02-03 LAB — VITAMIN D 25 HYDROXY (VIT D DEFICIENCY, FRACTURES): VITD: 17.75 ng/mL — ABNORMAL LOW (ref 30.00–100.00)

## 2020-02-03 NOTE — Assessment & Plan Note (Signed)
Improved on Vyvanse. He uses adderall on days he is not taking Vyvanse. Currently following with Dr Marchia Bond in [place], [place] however I may resume care in the future. Patient will let me know.

## 2020-02-03 NOTE — Patient Instructions (Signed)
Nice to see you!   

## 2020-02-03 NOTE — Assessment & Plan Note (Signed)
Chronic.CBC Repeated today. H/o seasonal allergies.

## 2020-02-03 NOTE — Progress Notes (Signed)
Subjective:    Patient ID: Logan Schmidt, male    DOB: 1990/07/21, 29 y.o.   MRN: [id number]  CC: Logan Schmidt is a 29 y.o. male who presents today for follow up.   HPI:  Compliant with Schmidt 40mg  daily on work days and is prescribed by in [place], [place] of [place].  Logan Logan Schmidt also prescribed adderall XR 10mg  to take on days ,particularly weekends when not taking Schmidt.   Started trial Schmidt as adderall was wearing off and he would 'crash' midafternoon.   No longer on adderall  Awaiting to be formally tested for ADHD.   Notes bruises easily.   Seasonal allergies. Has been tested for allergies, allergic to ragweed. Takes been on zyrtec in the past.    Recent increase stress at home as well as work. Marital counseling has been helpful.  Feels better about all and happy with co workers. He doesn't feel he needs medication for depression. He would like to resume regular exercise. No si/hi.   He has been compliant with vitamin D.   HISTORY:  Past Medical History:  Diagnosis Date  . ADD (attention deficit disorder)    Adderall  . Allergy    Cat, dogs  . Asthma    History reviewed. No pertinent surgical history. Family History  Problem Relation Age of Onset  . Cancer Mother        breast cancer  . Hyperlipidemia Father   . Hypertension Father   . Asthma Sister   . Heart disease Paternal Grandfather   . Hyperlipidemia Paternal Grandfather     Allergies: Penicillins Current Outpatient Medications on File Prior to Visit  Medication Sig Dispense Refill  . albuterol (VENTOLIN HFA) 108 (90 Base) MCG/ACT inhaler Inhale 2 puffs into the lungs every 6 (six) hours as needed for wheezing or shortness of breath. 6.7 g 0  . cetirizine (ZYRTEC) 10 MG tablet Take 10 mg by mouth daily.    . Cholecalciferol 20 MCG (800 UNIT) TABS Take 1 tablet by mouth daily.    Logan Schmidt 40 MG capsule Take 40 mg by mouth daily.     No current facility-administered medications  on file prior to visit.    Social History   Tobacco Use  . Smoking status: Never Smoker  . Smokeless tobacco: Never Used  Substance Use Topics  . Alcohol use: Yes    Alcohol/week: 10.0 standard drinks    Types: 5 Cans of beer, 5 Shots of liquor per week  . Drug use: No    Review of Systems  Constitutional: Negative for chills and fever.  Respiratory: Negative for cough.   Cardiovascular: Negative for chest pain and palpitations.  Gastrointestinal: Negative for nausea and vomiting.  Psychiatric/Behavioral: Negative for suicidal ideas. The patient is not nervous/anxious.       Objective:    BP 122/80   Pulse 85   Temp 98.3 F (36.8 C)   Ht 6\' 2"  (1.88 m)   Wt 184 lb 12.8 oz (83.8 kg)   SpO2 98%   BMI 23.73 kg/m  BP Readings from Last 3 Encounters:  02/03/20 122/80  10/01/19 118/64  12/18/17 140/84   Wt Readings from Last 3 Encounters:  02/03/20 184 lb 12.8 oz (83.8 kg)  10/01/19 182 lb 3.2 oz (82.6 kg)  07/01/19 185 lb (83.9 kg)    Physical Exam Vitals reviewed.  Constitutional:      Appearance: He is well-developed.  Cardiovascular:     Rate and  Rhythm: Regular rhythm.     Heart sounds: Normal heart sounds.  Pulmonary:     Effort: Pulmonary effort is normal. No respiratory distress.     Breath sounds: Normal breath sounds. No wheezing, rhonchi or rales.  Skin:    General: Skin is warm and dry.  Neurological:     Mental Status: He is alert.  Psychiatric:        Speech: Speech normal.        Behavior: Behavior normal.        Assessment & Plan:   Problem List Items Addressed This Visit      Other   ADD (attention deficit disorder)    Improved on Schmidt. He uses adderall on days he is not taking Schmidt. Currently following with Logan Schmidt in [place], [place] of [place] Schmidt however I may resume care in the future. Patient will let me know.       Eosinophilia    Chronic.CBC Repeated today. H/o seasonal allergies.       Vitamin D  deficiency - Primary   Relevant Orders   VITAMIN D 25 Hydroxy (Vit-D Deficiency, Fractures)    Other Visit Diagnoses    Eosinophilic leukocytosis           I have discontinued Logan Schmidt's amphetamine-dextroamphetamine. I am also having him maintain his cetirizine, albuterol, Schmidt, and Cholecalciferol.   No orders of the defined types were placed in this encounter.   Return precautions given.   Risks, benefits, and alternatives of the medications and treatment plan prescribed today were discussed, and patient expressed understanding.   Education regarding symptom management and diagnosis given to patient on AVS.  Continue to follow with Logan Grana, FNP for routine health maintenance.   Logan Schmidt and I agreed with plan.   Logan Plowman, FNP

## 2020-02-04 NOTE — Progress Notes (Signed)
LM with [facility] for someone to call me back in regards to notes about patient.

## 2020-02-05 ENCOUNTER — Other Ambulatory Visit: Payer: Self-pay | Admitting: Family

## 2020-02-05 DIAGNOSIS — D721 Eosinophilia, unspecified: Secondary | ICD-10-CM

## 2020-02-05 DIAGNOSIS — E559 Vitamin D deficiency, unspecified: Secondary | ICD-10-CM

## 2020-02-05 MED ORDER — CHOLECALCIFEROL 1.25 MG (50000 UT) PO TABS: ORAL_TABLET | ORAL | 0 refills | Status: AC

## 2020-02-18 DIAGNOSIS — F900 Attention-deficit hyperactivity disorder, predominantly inattentive type: Secondary | ICD-10-CM | POA: Diagnosis not present

## 2020-02-18 DIAGNOSIS — Z1389 Encounter for screening for other disorder: Secondary | ICD-10-CM | POA: Diagnosis not present

## 2020-02-22 DIAGNOSIS — Z20822 Contact with and (suspected) exposure to covid-19: Secondary | ICD-10-CM | POA: Diagnosis not present

## 2020-03-02 DIAGNOSIS — Z20822 Contact with and (suspected) exposure to covid-19: Secondary | ICD-10-CM | POA: Diagnosis not present

## 2020-05-05 ENCOUNTER — Ambulatory Visit: Payer: BC Managed Care – PPO | Admitting: Family

## 2020-05-05 DIAGNOSIS — Z0289 Encounter for other administrative examinations: Secondary | ICD-10-CM

## 2020-06-30 DIAGNOSIS — F900 Attention-deficit hyperactivity disorder, predominantly inattentive type: Secondary | ICD-10-CM | POA: Diagnosis not present

## 2020-07-07 DIAGNOSIS — F909 Attention-deficit hyperactivity disorder, unspecified type: Secondary | ICD-10-CM | POA: Diagnosis not present

## 2020-07-28 DIAGNOSIS — F909 Attention-deficit hyperactivity disorder, unspecified type: Secondary | ICD-10-CM | POA: Diagnosis not present

## 2020-09-22 DIAGNOSIS — F900 Attention-deficit hyperactivity disorder, predominantly inattentive type: Secondary | ICD-10-CM | POA: Diagnosis not present

## 2020-12-15 DIAGNOSIS — F900 Attention-deficit hyperactivity disorder, predominantly inattentive type: Secondary | ICD-10-CM | POA: Diagnosis not present

## 2020-12-15 DIAGNOSIS — Z79899 Other long term (current) drug therapy: Secondary | ICD-10-CM | POA: Diagnosis not present

## 2020-12-15 DIAGNOSIS — Z1389 Encounter for screening for other disorder: Secondary | ICD-10-CM | POA: Diagnosis not present

## 2021-03-01 DIAGNOSIS — F900 Attention-deficit hyperactivity disorder, predominantly inattentive type: Secondary | ICD-10-CM | POA: Diagnosis not present

## 2021-05-24 DIAGNOSIS — F900 Attention-deficit hyperactivity disorder, predominantly inattentive type: Secondary | ICD-10-CM | POA: Diagnosis not present

## 2021-08-24 DIAGNOSIS — F900 Attention-deficit hyperactivity disorder, predominantly inattentive type: Secondary | ICD-10-CM | POA: Diagnosis not present

## 2021-11-16 DIAGNOSIS — F900 Attention-deficit hyperactivity disorder, predominantly inattentive type: Secondary | ICD-10-CM | POA: Diagnosis not present

## 2021-12-27 ENCOUNTER — Ambulatory Visit: Payer: BC Managed Care – PPO | Admitting: Family

## 2022-02-15 DIAGNOSIS — F900 Attention-deficit hyperactivity disorder, predominantly inattentive type: Secondary | ICD-10-CM | POA: Diagnosis not present

## 2022-04-28 DIAGNOSIS — F4320 Adjustment disorder, unspecified: Secondary | ICD-10-CM | POA: Diagnosis not present

## 2022-05-10 DIAGNOSIS — F900 Attention-deficit hyperactivity disorder, predominantly inattentive type: Secondary | ICD-10-CM | POA: Diagnosis not present

## 2022-05-26 DIAGNOSIS — F900 Attention-deficit hyperactivity disorder, predominantly inattentive type: Secondary | ICD-10-CM | POA: Diagnosis not present

## 2022-05-26 DIAGNOSIS — F341 Dysthymic disorder: Secondary | ICD-10-CM | POA: Diagnosis not present

## 2022-06-02 DIAGNOSIS — F341 Dysthymic disorder: Secondary | ICD-10-CM | POA: Diagnosis not present

## 2022-06-02 DIAGNOSIS — F900 Attention-deficit hyperactivity disorder, predominantly inattentive type: Secondary | ICD-10-CM | POA: Diagnosis not present

## 2022-06-08 DIAGNOSIS — F900 Attention-deficit hyperactivity disorder, predominantly inattentive type: Secondary | ICD-10-CM | POA: Diagnosis not present

## 2022-06-08 DIAGNOSIS — F341 Dysthymic disorder: Secondary | ICD-10-CM | POA: Diagnosis not present

## 2022-06-23 DIAGNOSIS — F900 Attention-deficit hyperactivity disorder, predominantly inattentive type: Secondary | ICD-10-CM | POA: Diagnosis not present

## 2022-06-23 DIAGNOSIS — F341 Dysthymic disorder: Secondary | ICD-10-CM | POA: Diagnosis not present

## 2022-12-13 ENCOUNTER — Encounter: Payer: Self-pay | Admitting: Family

## 2022-12-13 ENCOUNTER — Ambulatory Visit: Payer: Managed Care, Other (non HMO) | Admitting: Family

## 2022-12-13 VITALS — BP 124/78 | HR 61 | Temp 97.7°F | Ht 73.0 in | Wt 180.8 lb

## 2022-12-13 DIAGNOSIS — F419 Anxiety disorder, unspecified: Secondary | ICD-10-CM

## 2022-12-13 DIAGNOSIS — R222 Localized swelling, mass and lump, trunk: Secondary | ICD-10-CM

## 2022-12-13 DIAGNOSIS — Z8639 Personal history of other endocrine, nutritional and metabolic disease: Secondary | ICD-10-CM | POA: Diagnosis not present

## 2022-12-13 DIAGNOSIS — Z1159 Encounter for screening for other viral diseases: Secondary | ICD-10-CM

## 2022-12-13 DIAGNOSIS — F32A Depression, unspecified: Secondary | ICD-10-CM

## 2022-12-13 DIAGNOSIS — D721 Eosinophilia, unspecified: Secondary | ICD-10-CM

## 2022-12-13 DIAGNOSIS — Z114 Encounter for screening for human immunodeficiency virus [HIV]: Secondary | ICD-10-CM

## 2022-12-13 LAB — CBC WITH DIFFERENTIAL/PLATELET
Basophils Absolute: 0 10*3/uL (ref 0.0–0.1)
Basophils Relative: 0.9 % (ref 0.0–3.0)
Eosinophils Absolute: 0.1 10*3/uL (ref 0.0–0.7)
Eosinophils Relative: 1.7 % (ref 0.0–5.0)
HCT: 44.3 % (ref 39.0–52.0)
Hemoglobin: 14.5 g/dL (ref 13.0–17.0)
Lymphocytes Relative: 23.7 % (ref 12.0–46.0)
Lymphs Abs: 1 10*3/uL (ref 0.7–4.0)
MCHC: 32.8 g/dL (ref 30.0–36.0)
MCV: 91.6 fL (ref 78.0–100.0)
Monocytes Absolute: 0.3 10*3/uL (ref 0.1–1.0)
Monocytes Relative: 8 % (ref 3.0–12.0)
Neutro Abs: 2.7 10*3/uL (ref 1.4–7.7)
Neutrophils Relative %: 65.7 % (ref 43.0–77.0)
Platelets: 152 10*3/uL (ref 150.0–400.0)
RBC: 4.84 Mil/uL (ref 4.22–5.81)
RDW: 12.9 % (ref 11.5–15.5)
WBC: 4.1 10*3/uL (ref 4.0–10.5)

## 2022-12-13 LAB — COMPREHENSIVE METABOLIC PANEL
ALT: 12 U/L (ref 0–53)
AST: 17 U/L (ref 0–37)
Albumin: 4.6 g/dL (ref 3.5–5.2)
Alkaline Phosphatase: 58 U/L (ref 39–117)
BUN: 11 mg/dL (ref 6–23)
CO2: 26 meq/L (ref 19–32)
Calcium: 9.4 mg/dL (ref 8.4–10.5)
Chloride: 103 meq/L (ref 96–112)
Creatinine, Ser: 1 mg/dL (ref 0.40–1.50)
GFR: 98.67 mL/min (ref >60.00)
Glucose, Bld: 86 mg/dL (ref 70–99)
Potassium: 3.9 meq/L (ref 3.5–5.1)
Sodium: 137 meq/L (ref 135–145)
Total Bilirubin: 0.6 mg/dL (ref 0.2–1.2)
Total Protein: 8.1 g/dL (ref 6.0–8.3)

## 2022-12-13 LAB — VITAMIN D 25 HYDROXY (VIT D DEFICIENCY, FRACTURES): VITD: 21.39 ng/mL — ABNORMAL LOW (ref 30.00–100.00)

## 2022-12-13 LAB — TSH: TSH: 1.05 u[IU]/mL (ref 0.35–5.50)

## 2022-12-13 NOTE — Progress Notes (Unsigned)
Assessment & Plan:  There are no diagnoses linked to this encounter.   Return precautions given.   Risks, benefits, and alternatives of the medications and treatment plan prescribed today were discussed, and patient expressed understanding.   Education regarding symptom management and diagnosis given to patient on AVS either electronically or printed.  No follow-ups on file.  Rennie Plowman, FNP  Subjective:    Patient ID: Logan Schmidt, male    DOB: [birth year], 32 y.o.   MRN: [id number]  CC: Logan Schmidt is a 32 y.o. male who presents today for follow up.   HPI: Here today to reestablish care and follow-up on labs   left supraclavicular knot which he has appreciated for years. Nontender. He does not think that it has changed in size.  No associated weight loss, night sweats, fever, cough  He lifts weights regularly, plays basketball.   He does describe  increase in anxiety over the last 4 years.  This is improved since COVID.  Some marital stress.  He has start counseling which has been helpful.  Denies thoughts of harming hurting himself or anyone else.       US soft tissue of neck 07/2016 -No definitive sonographic correlate for patient's palpable area of concern involving the left supraclavicular fossa.    Allergies: Penicillins Current Outpatient Medications on File Prior to Visit  Medication Sig Dispense Refill   albuterol (VENTOLIN HFA) 108 (90 Base) MCG/ACT inhaler Inhale 2 puffs into the lungs every 6 (six) hours as needed for wheezing or shortness of breath. 6.7 g 0   cetirizine (ZYRTEC) 10 MG tablet Take 10 mg by mouth daily.     Cholecalciferol 1.25 MG (50000 UT) TABS 50,000 units PO qwk for 8 weeks. 8 tablet 0   VYVANSE 40 MG capsule Take 40 mg by mouth daily.     No current facility-administered medications on file prior to visit.    Review of Systems  Constitutional:  Negative for chills and fever.  HENT:  Negative for congestion, ear pain, rhinorrhea,  sinus pressure and sore throat.   Respiratory:  Negative for cough, shortness of breath and wheezing.   Cardiovascular:  Negative for chest pain and palpitations.  Gastrointestinal:  Negative for diarrhea, nausea and vomiting.  Genitourinary:  Negative for dysuria.  Musculoskeletal:  Negative for myalgias.  Skin:  Negative for rash.  Neurological:  Negative for headaches.  Hematological:  Positive for adenopathy.  Psychiatric/Behavioral:  Negative for sleep disturbance and suicidal ideas. The patient is nervous/anxious.       Objective:    BP 124/78   Pulse 61   Temp 97.7 F (36.5 C) (Oral)   Ht 6\' 1"  (1.854 m)   Wt 180 lb 12.8 oz (82 kg)   SpO2 99%   BMI 23.85 kg/m  BP Readings from Last 3 Encounters:  12/13/22 124/78  02/03/20 122/80  10/01/19 118/64   Wt Readings from Last 3 Encounters:  12/13/22 180 lb 12.8 oz (82 kg)  02/03/20 184 lb 12.8 oz (83.8 kg)  10/01/19 182 lb 3.2 oz (82.6 kg)    Physical Exam Vitals reviewed.  Constitutional:      Appearance: He is well-developed.  Neck:     Thyroid: No thyroid mass or thyromegaly.   Cardiovascular:     Rate and Rhythm: Regular rhythm.     Heart sounds: Normal heart sounds.  Pulmonary:     Effort: Pulmonary effort is normal. No respiratory distress.     Breath sounds: Normal  breath sounds. No wheezing, rhonchi or rales.  Lymphadenopathy:     Head:     Right side of head: No submental, submandibular, tonsillar, preauricular, posterior auricular or occipital adenopathy.     Left side of head: No submental, submandibular, tonsillar, preauricular, posterior auricular or occipital adenopathy.     Cervical: No cervical adenopathy.     Right cervical: No superficial, deep or posterior cervical adenopathy.    Left cervical: No superficial, deep or posterior cervical adenopathy.     Upper Body:     Right upper body: No supraclavicular adenopathy.     Left upper body: Supraclavicular adenopathy present.     Comments:  Poorly circumscribed asymmetry noted left supraclavicular area.  Nontender.  Area is nonfluctuant.  No erythema, increased warmth.  Of note, left shoulder is lower than right shoulder.    Skin:    General: Skin is warm and dry.  Neurological:     Mental Status: He is alert.  Psychiatric:        Speech: Speech normal.        Behavior: Behavior normal.

## 2022-12-13 NOTE — Assessment & Plan Note (Signed)
Previously evaluated with ultrasound without definitive mass.  Patient feels what he is feeling at this time is in a different location.  We opted to pursue CT of neck to evaluate for lymphadenopathy versus asymmetry and muscular mass.

## 2022-12-14 ENCOUNTER — Encounter: Payer: Self-pay | Admitting: Family

## 2022-12-14 DIAGNOSIS — F32A Depression, unspecified: Secondary | ICD-10-CM | POA: Insufficient documentation

## 2022-12-14 LAB — HEPATITIS C ANTIBODY: Hepatitis C Ab: NONREACTIVE

## 2022-12-14 LAB — HIV ANTIBODY (ROUTINE TESTING W REFLEX): HIV 1&2 Ab, 4th Generation: NONREACTIVE

## 2022-12-14 NOTE — Addendum Note (Signed)
Addended by: Allegra Grana on: 12/14/2022 02:08 PM   Modules accepted: Level of Service

## 2022-12-14 NOTE — Patient Instructions (Signed)
Nice to see you today!

## 2022-12-14 NOTE — Assessment & Plan Note (Signed)
PHQ score worsened from year prior.  We discussed symptoms and overall patient feels symptoms are improved.  He is currently seeing a Veterinary surgeon.  He politely declines medication at this time.  Advised patient to let me know how he is doing.

## 2022-12-22 ENCOUNTER — Encounter: Payer: Self-pay | Admitting: *Deleted

## 2022-12-23 ENCOUNTER — Ambulatory Visit: Payer: Managed Care, Other (non HMO)

## 2022-12-26 ENCOUNTER — Ambulatory Visit
Admission: RE | Admit: 2022-12-26 | Discharge: 2022-12-26 | Disposition: A | Discharge status: Home / Self Care | Payer: Managed Care, Other (non HMO) | Source: Ambulatory Visit | Attending: Family

## 2022-12-26 DIAGNOSIS — R222 Localized swelling, mass and lump, trunk: Secondary | ICD-10-CM

## 2022-12-26 MED ORDER — IOPAMIDOL (ISOVUE-300) INJECTION 61%
75.0000 mL | Freq: Once | INTRAVENOUS | Status: AC | PRN
  Administered 2022-12-26: 75 mL via INTRAVENOUS

## 2023-01-12 ENCOUNTER — Encounter: Payer: Self-pay | Admitting: Family

## 2023-01-20 ENCOUNTER — Encounter: Payer: Self-pay | Admitting: Family

## 2023-02-07 ENCOUNTER — Other Ambulatory Visit: Payer: Self-pay | Admitting: Family

## 2023-02-07 DIAGNOSIS — R911 Solitary pulmonary nodule: Secondary | ICD-10-CM

## 2023-02-14 ENCOUNTER — Ambulatory Visit: Payer: Managed Care, Other (non HMO) | Admitting: Pulmonary Disease

## 2023-02-14 ENCOUNTER — Encounter: Payer: Self-pay | Admitting: Pulmonary Disease

## 2023-02-14 VITALS — BP 120/80 | HR 90 | Temp 97.3°F | Ht 73.0 in | Wt 181.6 lb

## 2023-02-14 DIAGNOSIS — R0602 Shortness of breath: Secondary | ICD-10-CM

## 2023-02-14 DIAGNOSIS — J4530 Mild persistent asthma, uncomplicated: Secondary | ICD-10-CM | POA: Diagnosis not present

## 2023-02-14 DIAGNOSIS — R911 Solitary pulmonary nodule: Secondary | ICD-10-CM | POA: Diagnosis not present

## 2023-02-14 LAB — NITRIC OXIDE: Nitric Oxide: 97

## 2023-02-14 MED ORDER — BUDESONIDE-FORMOTEROL FUMARATE 160-4.5 MCG/ACT IN AERO: 2.0000 | INHALATION_SPRAY | Freq: Two times a day (BID) | RESPIRATORY_TRACT | 11 refills | Status: DC

## 2023-02-14 MED ORDER — ALBUTEROL SULFATE HFA 108 (90 BASE) MCG/ACT IN AERS: 2.0000 | INHALATION_SPRAY | Freq: Four times a day (QID) | RESPIRATORY_TRACT | 6 refills | Status: AC | PRN

## 2023-02-14 NOTE — Patient Instructions (Signed)
VISIT SUMMARY:  During today's visit, we discussed the incidental finding of a small lung nodule discovered on a CT scan of your shoulder, your asthma management, and a deviated septum. We also reviewed your general health maintenance, including the flu shot, which you declined.  YOUR PLAN:  -PULMONARY NODULE: A pulmonary nodule is a small, round growth in the lung. Given its small size and your history of vaping, we have a low suspicion for cancer, but we need to monitor it. We will do a dedicated chest CT scan in six months to check for any changes.  -ASTHMA: Asthma is a condition where your airways become inflamed and narrow, making it hard to breathe. Your asthma seems to be triggered by exercise and allergies. To better control your symptoms, we are starting you on Symbicort, two puffs twice a day, and refilling your albuterol inhaler for use as needed. Please avoid exposure to cats or take Zyrtec when around them. We will reassess your asthma and airway inflammation in 2-3 months.  -DEVIATED SEPTUM: A deviated septum is when the thin wall between your nasal passages is displaced to one side, which can cause breathing issues. Your CT scan shows a deviated septum with some inflammation, but no immediate treatment is needed. We will monitor your symptoms.  INSTRUCTIONS:  Please schedule a follow-up appointment in 3 months to reassess your asthma and airway inflammation. Additionally, we will need to do a dedicated chest CT scan in 3 months to monitor the lung nodule.

## 2023-02-14 NOTE — Progress Notes (Signed)
Subjective:    Patient ID: Logan Schmidt, male    DOB: April 13, 1990, 31 y.o.   MRN: [id number]  Patient Care Team: Allegra Grana, FNP as PCP - General [facility])  Chief Complaint  Patient presents with   Consult    Nodule. No SOB or wheezing. Cough from allergies.    BACKGROUND: 32 year old lifelong never smoker (vape user) who presents for evaluation of an incidental lung nodule noted on CT scan of the soft tissue of the neck.  Patient also has underlying history of asthma since childhood.  HPI Discussed the use of AI scribe software for clinical note transcription with the patient, who gave verbal consent to proceed.  History of Present Illness   The patient, with a history of asthma, presents for evaluation of a lung nodule incidentally discovered on a CT scan of the neck. The nodule is 3mm in size. The patient reports no recent changes in respiratory symptoms and denies any noticeable breathing problems. He has a history of childhood asthma, which is typically exercise or allergy induced. He uses an albuterol inhaler as needed, which is reportedly infrequent.  The patient admits to vaping, although not daily. He also reports recent exposure to multiple cats at a friend's house, despite known cat allergies. He has not been taking antihistamines during know allergen exposure.  In addition to the lung nodule, the patient has a shoulder issue, which was the original reason for the CT scan. He reports a persistent discomfort following a previous shoulder sprain. The injury was classified as grade one or two and did not require surgery. 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   Review of systems, otherwise negative.     Review of Systems A 10 point review of systems was performed and it is as noted above otherwise negative.   Past Medical History:  Diagnosis Date   ADD (attention deficit  disorder)    Adderall   Allergy    Cat, dogs   Asthma     History reviewed. No pertinent surgical history.  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   Asthma 03/02/2015   ADD (attention deficit disorder) 03/02/2015    Family History  Problem Relation Age of Onset   Cancer Mother        breast cancer   Hyperlipidemia Father    Hypertension Father    Asthma Sister    Heart disease Paternal Grandfather    Hyperlipidemia Paternal Grandfather     Social History   Tobacco Use   Smoking status: Never   Smokeless tobacco: Never   Tobacco comments:    'social smoker'  Substance Use Topics   Alcohol use: Yes    Alcohol/week: 10.0 standard drinks of alcohol    Types: 5 Cans of beer, 5 Shots of liquor per week    Allergies  Allergen Reactions   Penicillins Swelling    Current Meds  Medication Sig   albuterol (VENTOLIN HFA) 108 (90 Base) MCG/ACT inhaler Inhale 2 puffs into the lungs every 6 (six) hours as needed for wheezing or shortness of breath.  cetirizine (ZYRTEC) 10 MG tablet Take 10 mg by mouth daily.   Cholecalciferol 1.25 MG (50000 UT) TABS 50,000 units PO qwk for 8 weeks.   methylphenidate 54 MG PO CR tablet Take 54 mg by mouth daily.    Immunization History  Administered Date(s) Administered   DTaP 04/06/1989, 06/22/1989, 10/30/1989, 10/24/1990, 08/08/1994   HIB (PRP-OMP) 04/06/1989, 10/24/1990   Hepatitis A 09/10/2007   Hepatitis B 11/30/2000, 01/04/2001, 05/24/2001   IPV 04/06/1989, 06/22/1989, 10/24/1990, 08/08/1994   Janssen (J&J) SARS-COV-2 Vaccination 06/18/2019   MMR 10/24/1990,  08/08/1994   Meningococcal Conjugate 10/13/2006   Td 06/09/2004, 09/10/2007   Tdap 09/10/2007, 02/20/2019        Objective:   BP 120/80 (BP Location: Right Arm, Cuff Size: Normal)   Pulse 90   Temp (!) 97.3 F (36.3 C)   Ht 6\' 1"  (1.854 m)   Wt 181 lb 9.6 oz (82.4 kg)   SpO2 98%   BMI 23.96 kg/m   SpO2: 98 % O2 Device: None (Room air)  GENERAL: Well-developed, well-nourished gentleman, no acute distress.  Fully ambulatory.  Conversational dyspnea. HEAD: Normocephalic, atraumatic.  EYES: Pupils equal, round, reactive to light.  No scleral icterus.  MOUTH: Dentition intact, oral mucosa moist.  No thrush. NECK: Supple. No thyromegaly. Trachea midline. No JVD.  No adenopathy. PULMONARY: Good air entry bilaterally.  Coarse, otherwise, no adventitious sounds. CARDIOVASCULAR: S1 and S2. Regular rate and rhythm.  No rubs, murmurs or gallops heard. ABDOMEN: Benign. MUSCULOSKELETAL: No joint deformity, no clubbing, no edema.  NEUROLOGIC: No overt focal deficit, no gait disturbance, speech is fluent. SKIN: Intact,warm,dry. PSYCH: Mood and behavior normal.  Representative image from CT scan, soft tissue neck, performed 26 December 2022 showing the 3 mm nodule in question (arrow):     Lab Results  Component Value Date   NITRICOXIDE 97 02/14/2023  *Consistent with type II inflammation   Assessment & Plan:     ICD-10-CM   1. Mild persistent asthma without complication  J45.30 Nitric oxide    2. Lung nodule seen on imaging study  R91.1 CT CHEST WO CONTRAST     Orders Placed This Encounter  Procedures   CT CHEST WO CONTRAST    Standing Status:   Future    Standing Expiration Date:   02/14/2024    Order Specific Question:   Preferred imaging location?    Answer:   [facility]   Nitric oxide   Meds ordered this encounter  Medications   albuterol (VENTOLIN HFA) 108 (90 Base) MCG/ACT inhaler    Sig: Inhale 2 puffs into the lungs every 6 (six) hours as needed for  wheezing or shortness of breath.    Dispense:  13.4 g    Refill:  6   budesonide-formoterol (SYMBICORT) 160-4.5 MCG/ACT inhaler    Sig: Inhale 2 puffs into the lungs 2 (two) times daily.    Dispense:  1 each    Refill:  11   Discussion:    Pulmonary Nodule Incidental finding of a small pulmonary nodule on a CT scan performed for shoulder evaluation. The nodule is 3 mm in size. No other nodules identified. Given the patient's history of vaping, there is a low suspicion for malignancy, but follow-up is necessary to monitor for changes. - Order dedicated chest CT in 3 months to monitor the nodule  Asthma Asthma, primarily exercise and allergy induced. Currently not on maintenance medication, uses albuterol inhaler as needed. Recent assessment shows increased airway inflammation, likely exacerbated by exposure  to allergens (cats) and possibly vaping. Patient reports rare use of albuterol inhaler. Discussed starting Symbicort to reduce inflammation and improve control. Explained potential need for dosage adjustment if inflammation remains high. Advised to avoid exposure to cats or take Zyrtec while around cats. - Prescribe Symbicort 160/4.5, 2 puffs BID - Refill albuterol inhaler for use as needed - Advise to avoid exposure to cats or take Zyrtec while around cats - Schedule follow-up appointment in 3 months to reassess asthma and airway inflammation  Deviated Septum/Rhinitis CT scan reveals a deviated septum with associated inflammation/turbinate edema, likely contributing to respiratory symptoms and possibly exacerbating asthma. No immediate intervention required. - Monitor symptoms, no immediate intervention required - Nasal hygiene  General Health Maintenance Patient has not received a flu shot for the current season. - Offered flu shot, patient declined.      Gailen Shelter, MD Advanced Bronchoscopy [facility]    *This note was dictated using voice  recognition software/Dragon.  Despite best efforts to proofread, errors can occur which can change the meaning. Any transcriptional errors that result from this process are unintentional and may not be fully corrected at the time of dictation.

## 2023-02-15 ENCOUNTER — Encounter: Payer: Self-pay | Admitting: Family

## 2023-02-15 DIAGNOSIS — R911 Solitary pulmonary nodule: Secondary | ICD-10-CM | POA: Insufficient documentation

## 2023-05-26 ENCOUNTER — Other Ambulatory Visit: Payer: Managed Care, Other (non HMO)

## 2023-06-08 ENCOUNTER — Ambulatory Visit: Payer: Managed Care, Other (non HMO) | Admitting: Pulmonary Disease

## 2023-06-08 VITALS — BP 130/80 | HR 68 | Temp 97.6°F | Ht 73.0 in | Wt 185.6 lb

## 2023-06-08 DIAGNOSIS — R911 Solitary pulmonary nodule: Secondary | ICD-10-CM | POA: Diagnosis not present

## 2023-06-08 DIAGNOSIS — J4530 Mild persistent asthma, uncomplicated: Secondary | ICD-10-CM

## 2023-06-08 LAB — NITRIC OXIDE: Nitric Oxide: 50

## 2023-06-08 MED ORDER — TRELEGY ELLIPTA 100-62.5-25 MCG/ACT IN AEPB: 1.0000 | INHALATION_SPRAY | Freq: Every day | RESPIRATORY_TRACT | 11 refills | Status: AC

## 2023-06-08 NOTE — Patient Instructions (Signed)
 VISIT SUMMARY:  Today, we discussed your asthma management. Your asthma has been stable, but we need to improve your medication routine to ensure it stays that way.  YOUR PLAN:  -ASTHMA: Asthma is a condition where your airways become inflamed and narrow, making it hard to breathe. Your asthma has been stable, but you haven't been using your Symbicort inhaler regularly. We are switching you to Trelegy Ellipta 100, which you only need to use once a day. This should help you stick to your treatment plan better. Remember to rinse your mouth well after using the inhaler to prevent any side effects. We will provide you with coupons for the inhaler to help with the cost.  INSTRUCTIONS:  Please schedule a follow-up appointment in four months. If you experience any new problems before then, contact us immediately.

## 2023-06-08 NOTE — Progress Notes (Signed)
 Subjective:    Patient ID: Logan Schmidt, male    DOB: 1990-06-08, 33 y.o.   MRN: [id number]  Patient Care Team: Allegra Grana, FNP as PCP - General [facility])  Chief Complaint  Patient presents with   Follow-up    No cough, shortness of breath or wheezing.     BACKGROUND/INTERVAL:33 year old lifelong never smoker (vape user) who presents for evaluation of an incidental lung nodule noted on CT scan of the soft tissue of the neck. Patient also has underlying history of asthma since childhood.  Initially evaluated here on 14 February 2023.  He has discontinued vaping since that visit.  HPI Discussed the use of AI scribe software for clinical note transcription with the patient, who gave verbal consent to proceed.  History of Present Illness   Logan Schmidt is a 33 year old male with asthma who presents for follow-up of his asthma management.  His asthma has been stable with no recent exacerbations. He uses albuterol as needed, approximately once a month, particularly during allergy flare-ups or mild shortness of breath. He has not been using Symbicort as regularly as prescribed, describing his use as 'sporadic.'  He recalls prior exposure to a friend's cats, which may have been a trigger in the past.  He does not endorse any cough, shortness of breath or wheezing today.  He has not been contacted for the scheduled breathing tests, which were previously ordered.     Review of Systems A 10 point review of systems was performed and it is as noted above otherwise negative.   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   amphetamine-dextroamphetamine (ADDERALL XR) 20 MG 24 hr capsule Take 20 mg by mouth daily.   amphetamine-dextroamphetamine (ADDERALL) 5 MG tablet Take 1 tablet by mouth daily.   cetirizine (ZYRTEC) 10 MG tablet Take 10 mg by mouth daily.   Cholecalciferol 1.25 MG (50000 UT) TABS 50,000 units PO qwk for 8 weeks.   Fluticasone-Umeclidin-Vilant (TRELEGY ELLIPTA) 100-62.5-25 MCG/ACT AEPB Inhale 1 puff into the lungs daily.   methylphenidate 54 MG PO CR tablet Take 54 mg by mouth daily.   [DISCONTINUED] budesonide-formoterol (SYMBICORT) 160-4.5 MCG/ACT inhaler Inhale 2 puffs into the lungs 2 (two) times daily.    Immunization History  Administered Date(s) Administered   DTaP 04/06/1989, 06/22/1989, 10/30/1989, 10/24/1990, 08/08/1994   HIB (PRP-OMP) 04/06/1989, 10/24/1990   Hepatitis A 09/10/2007   Hepatitis B 11/30/2000, 01/04/2001,  05/24/2001   IPV 04/06/1989, 06/22/1989, 10/24/1990, 08/08/1994   Janssen (J&J) SARS-COV-2 Vaccination 06/18/2019   MMR 10/24/1990, 08/08/1994   Meningococcal Conjugate 10/13/2006   Td 06/09/2004, 09/10/2007   Tdap 09/10/2007, 02/20/2019        Objective:     BP 130/80 (BP Location: Left Arm, Patient Position: Sitting, Cuff Size: Normal)   Pulse 68   Temp 97.6 F (36.4 C) (Temporal)   Ht 6\' 1"  (1.854 m)   Wt 185 lb 9.6 oz (84.2 kg)    SpO2 98%   BMI 24.49 kg/m   SpO2: 98 %  GENERAL: Well-developed, well-nourished gentleman, no acute distress.  Fully ambulatory.  Conversational dyspnea. HEAD: Normocephalic, atraumatic.  EYES: Pupils equal, round, reactive to light.  No scleral icterus.  MOUTH: Dentition intact, oral mucosa moist.  No thrush. NECK: Supple. No thyromegaly. Trachea midline. No JVD.  No adenopathy. PULMONARY: Good air entry bilaterally.  Coarse, otherwise, no adventitious sounds. CARDIOVASCULAR: S1 and S2. Regular rate and rhythm.  No rubs, murmurs or gallops heard. ABDOMEN: Benign. MUSCULOSKELETAL: No joint deformity, no clubbing, no edema.  NEUROLOGIC: No overt focal deficit, no gait disturbance, speech is fluent. SKIN: Intact,warm,dry. PSYCH: Mood and behavior normal.:  Lab Results  Component Value Date   NITRICOXIDE 50 06/08/2023  *Evidence of type II inflammation    Assessment & Plan:     ICD-10-CM   1. Mild persistent asthma without complication  J45.30 Nitric oxide    Pulmonary Function Test [facility] Only    2. Lung nodule seen on imaging study  R91.1       Orders Placed This Encounter  Procedures   Nitric oxide   Pulmonary Function Test [facility] Only    Standing Status:   Future    Expected Date:   07/09/2023    Expiration Date:   06/07/2024    Full PFT: includes the following: basic spirometry, spirometry pre & post bronchodilator, diffusion capacity (DLCO), lung volumes:   Full PFT    This test can only be performed at:   [facility] ordered this encounter  Medications   Fluticasone-Umeclidin-Vilant (TRELEGY ELLIPTA) 100-62.5-25 MCG/ACT AEPB    Sig: Inhale 1 puff into the lungs daily.    Dispense:  60 each    Refill:  11   Discussion:    Asthma Asthma is well-managed with sporadic use of albuterol, primarily during allergy flare-ups. He has not been using Symbicort regularly.  Today has elevated nitric oxide levels at 50 indicate airway inflammation. No wheezing on  examination. Switching to Trelegy Ellipta 100 is expected to improve compliance due to its once-daily dosage. - Switch to Trelegy Ellipta 100 for once-daily dosage to improve compliance - Instruct to rinse mouth well after using the inhaler - Offer coupons for the inhaler - Schedule follow-up in four months - Ensure contact prior to follow-up if new problems arise     Lung nodule Seen during CT of the neck.  Follow-up CT of the chest order, patient has not had yet. - Schedule CT of the chest previously ordered  Advised if symptoms do not improve or worsen, to please contact office for sooner follow up or seek emergency care.    I spent 34 minutes of dedicated to the care of this patient on the date of this encounter to include pre-visit review of records, face-to-face time with the patient discussing conditions above, post visit ordering of testing, clinical documentation with the electronic health record, making appropriate referrals as  documented, and communicating necessary findings to members of the patients care team.     C. Danice Goltz, MD Advanced Bronchoscopy PCCM [facility]    *This note was generated using voice recognition software/Dragon and/or AI transcription program.  Despite best efforts to proofread, errors can occur which can change the meaning. Any transcriptional errors that result from this process are unintentional and may not be fully corrected at the time of dictation.

## 2023-06-20 ENCOUNTER — Encounter: Payer: Self-pay | Admitting: Pulmonary Disease

## 2023-08-28 DIAGNOSIS — F900 Attention-deficit hyperactivity disorder, predominantly inattentive type: Secondary | ICD-10-CM | POA: Diagnosis not present

## 2023-10-10 ENCOUNTER — Ambulatory Visit: Payer: Self-pay | Admitting: Pulmonary Disease

## 2023-12-21 ENCOUNTER — Other Ambulatory Visit (HOSPITAL_COMMUNITY)
Admission: RE | Admit: 2023-12-21 | Discharge: 2023-12-21 | Disposition: A | Discharge status: Home / Self Care | Source: Ambulatory Visit | Attending: Family | Admitting: Family

## 2023-12-21 ENCOUNTER — Ambulatory Visit: Payer: Managed Care, Other (non HMO) | Admitting: Family

## 2023-12-21 ENCOUNTER — Encounter: Payer: Self-pay | Admitting: Family

## 2023-12-21 VITALS — BP 120/72 | HR 80 | Temp 97.8°F | Ht 73.0 in | Wt 178.8 lb

## 2023-12-21 DIAGNOSIS — Z0001 Encounter for general adult medical examination with abnormal findings: Secondary | ICD-10-CM | POA: Diagnosis not present

## 2023-12-21 DIAGNOSIS — Z113 Encounter for screening for infections with a predominantly sexual mode of transmission: Secondary | ICD-10-CM | POA: Diagnosis not present

## 2023-12-21 DIAGNOSIS — Z13220 Encounter for screening for lipoid disorders: Secondary | ICD-10-CM

## 2023-12-21 DIAGNOSIS — Z136 Encounter for screening for cardiovascular disorders: Secondary | ICD-10-CM | POA: Diagnosis not present

## 2023-12-21 DIAGNOSIS — S30860A Insect bite (nonvenomous) of lower back and pelvis, initial encounter: Secondary | ICD-10-CM

## 2023-12-21 DIAGNOSIS — W57XXXA Bitten or stung by nonvenomous insect and other nonvenomous arthropods, initial encounter: Secondary | ICD-10-CM

## 2023-12-21 NOTE — Assessment & Plan Note (Signed)
 No systemic features. Tick was not engorged or attached >72 hrs as recalled by patient. Pending lyme abs. Counseled on lyme disease features.

## 2023-12-21 NOTE — Patient Instructions (Signed)
 Nice to see you!  Health Maintenance, Male Adopting a healthy lifestyle and getting preventive care are important in promoting health and wellness. Ask your health care provider about: The right schedule for you to have regular tests and exams. Things you can do on your own to prevent diseases and keep yourself healthy. What should I know about diet, weight, and exercise? Eat a healthy diet  Eat a diet that includes plenty of vegetables, fruits, low-fat dairy products, and lean protein. Do not eat a lot of foods that are high in solid fats, added sugars, or sodium. Maintain a healthy weight Body mass index (BMI) is a measurement that can be used to identify possible weight problems. It estimates body fat based on height and weight. Your health care provider can help determine your BMI and help you achieve or maintain a healthy weight. Get regular exercise Get regular exercise. This is one of the most important things you can do for your health. Most adults should: Exercise for at least 150 minutes each week. The exercise should increase your heart rate and make you sweat (moderate-intensity exercise). Do strengthening exercises at least twice a week. This is in addition to the moderate-intensity exercise. Spend less time sitting. Even light physical activity can be beneficial. Watch cholesterol and blood lipids Have your blood tested for lipids and cholesterol at 35 years of age, then have this test every 5 years. You may need to have your cholesterol levels checked more often if: Your lipid or cholesterol levels are high. You are older than 35 years of age. You are at high risk for heart disease. What should I know about cancer screening? Many types of cancers can be detected early and may often be prevented. Depending on your health history and family history, you may need to have cancer screening at various ages. This may include screening for: Colorectal cancer. Prostate cancer. Skin  cancer. Lung cancer. What should I know about heart disease, diabetes, and high blood pressure? Blood pressure and heart disease High blood pressure causes heart disease and increases the risk of stroke. This is more likely to develop in people who have high blood pressure readings or are overweight. Talk with your health care provider about your target blood pressure readings. Have your blood pressure checked: Every 3-5 years if you are 22-84 years of age. Every year if you are 26 years old or older. If you are between the ages of 38 and 35 and are a current or former smoker, ask your health care provider if you should have a one-time screening for abdominal aortic aneurysm (AAA). Diabetes Have regular diabetes screenings. This checks your fasting blood sugar level. Have the screening done: Once every three years after age 54 if you are at a normal weight and have a low risk for diabetes. More often and at a younger age if you are overweight or have a high risk for diabetes. What should I know about preventing infection? Hepatitis B If you have a higher risk for hepatitis B, you should be screened for this virus. Talk with your health care provider to find out if you are at risk for hepatitis B infection. Hepatitis C Blood testing is recommended for: Everyone born from [birth year] through 1967. Anyone with known risk factors for hepatitis C. Sexually transmitted infections (STIs) You should be screened each year for STIs, including gonorrhea and chlamydia, if: You are sexually active and are younger than 35 years of age. You are older than 35  years of age and your health care provider tells you that you are at risk for this type of infection. Your sexual activity has changed since you were last screened, and you are at increased risk for chlamydia or gonorrhea. Ask your health care provider if you are at risk. Ask your health care provider about whether you are at high risk for HIV. Your health  care provider may recommend a prescription medicine to help prevent HIV infection. If you choose to take medicine to prevent HIV, you should first get tested for HIV. You should then be tested every 3 months for as long as you are taking the medicine. Follow these instructions at home: Alcohol use Do not drink alcohol if your health care provider tells you not to drink. If you drink alcohol: Limit how much you have to 0-2 drinks a day. Know how much alcohol is in your drink. In the [place] one drink equals one 12 oz bottle of beer (355 mL), one 5 oz glass of wine (148 mL), or one 1 oz glass of hard liquor (44 mL). Lifestyle Do not use any products that contain nicotine or tobacco. These products include cigarettes, chewing tobacco, and vaping devices, such as e-cigarettes. If you need help quitting, ask your health care provider. Do not use street drugs. Do not share needles. Ask your health care provider for help if you need support or information about quitting drugs. General instructions Schedule regular health, dental, and eye exams. Stay current with your vaccines. Tell your health care provider if: You often feel depressed. You have ever been abused or do not feel safe at home. Summary Adopting a healthy lifestyle and getting preventive care are important in promoting health and wellness. Follow your health care provider's instructions about healthy diet, exercising, and getting tested or screened for diseases. Follow your health care provider's instructions on monitoring your cholesterol and blood pressure. This information is not intended to replace advice given to you by your health care provider. Make sure you discuss any questions you have with your health care provider. Document Revised: 07/20/2020 Document Reviewed: 07/20/2020 Elsevier Patient Education  2024 [employer].

## 2023-12-21 NOTE — Progress Notes (Signed)
 Assessment & Plan:  Encounter for routine adult physical exam with abnormal findings Assessment & Plan: Encouraged exercise. STD testing ordered as requested. Declines influenza.  Orders: -     Comprehensive metabolic panel with GFR -     CBC with Differential/Platelet -     Lipid panel -     HIV Antibody (routine testing w rflx) -     RPR -     GC/Chlamydia Probe Amp -     Acute Hep Panel & Hep B Surface Ab -     Cytology, urine  Tick bite of lower back, initial encounter Assessment & Plan: No systemic features. Tick was not engorged or attached >72 hrs as recalled by patient. Pending lyme abs. Counseled on lyme disease features.   Orders: -     Lyme Disease Serology w/Reflex  Encounter for lipid screening for cardiovascular disease -     Lipid panel     Return precautions given.   Risks, benefits, and alternatives of the medications and treatment plan prescribed today were discussed, and patient expressed understanding.   Education regarding symptom management and diagnosis given to patient on AVS either electronically or printed.  Return for Complete Physical Exam.  Rollene Northern, FNP  Subjective:    Patient ID: Logan Schmidt, male    DOB: 11/02/1990, 33 y.o.   MRN: [id number]  CC: Ruford Dudzinski is a 33 y.o. male who presents today for physical exam.    HPI: Complains of tick bite on his back 7 months ago after hiking He describes redness around the bite and itchiness. Removed the tick approx 24 hours after. Doesn't recall being engorged.  No associated bullseye rash, fever, generalized rash, unusual joint pain.  More recently two small he had suspected lonestar ticks this 2 weeks ago    Recently divorced; requests STD screen. No recent contact or abnormal penile discharge.   NO FDR with colon cancer.   Immunizations       Tetanus - UTD        Pneumococcal - Candidate for; declines  Exercise: Gets regular exercise, playing basketball.   Alcohol use:   none Smoking/tobacco use: Nonsmoker.    Health Maintenance  Topic Date Due   Pneumococcal Vaccine (1 of 2 - PCV) Never done   HPV Vaccine (1 - 3-dose SCDM series) Never done   Flu Shot  Never done   COVID-19 Vaccine (2 - 2025-26 season) 01/06/2024*   DTaP/Tdap/Td vaccine (10 - Td or Tdap) 02/19/2029   Hepatitis B Vaccine  Completed   Hepatitis C Screening  Completed   HIV Screening  Completed   Meningitis B Vaccine  Aged Out  *Topic was postponed. The date shown is not the original due date.     ALLERGIES: Penicillins  Current Outpatient Medications on File Prior to Visit  Medication Sig Dispense Refill   albuterol  (VENTOLIN  HFA) 108 (90 Base) MCG/ACT inhaler Inhale 2 puffs into the lungs every 6 (six) hours as needed for wheezing or shortness of breath. 13.4 g 6   amphetamine -dextroamphetamine  (ADDERALL XR) 20 MG 24 hr capsule Take 20 mg by mouth daily.     amphetamine -dextroamphetamine  (ADDERALL) 5 MG tablet Take 1 tablet by mouth daily.     cetirizine (ZYRTEC) 10 MG tablet Take 10 mg by mouth daily.     Cholecalciferol  1.25 MG (50000 UT) TABS 50,000 units PO qwk for 8 weeks. 8 tablet 0   Fluticasone-Umeclidin-Vilant (TRELEGY ELLIPTA ) 100-62.5-25 MCG/ACT AEPB Inhale 1 puff into the  lungs daily. 60 each 11   methylphenidate 54 MG PO CR tablet Take 54 mg by mouth daily.     No current facility-administered medications on file prior to visit.    Review of Systems  Constitutional:  Negative for chills and fever.  Respiratory:  Negative for cough.   Cardiovascular:  Negative for chest pain and palpitations.  Gastrointestinal:  Negative for nausea and vomiting.  Musculoskeletal:  Negative for arthralgias.  Skin:  Negative for rash.      Objective:    BP 120/72   Pulse 80   Temp 97.8 F (36.6 C) (Oral)   Ht 6' 1 (1.854 m)   Wt 178 lb 12.8 oz (81.1 kg)   SpO2 98%   BMI 23.59 kg/m   BP Readings from Last 3 Encounters:  12/21/23 120/72  06/08/23 130/80  02/14/23  120/80   Wt Readings from Last 3 Encounters:  12/21/23 178 lb 12.8 oz (81.1 kg)  06/08/23 185 lb 9.6 oz (84.2 kg)  02/14/23 181 lb 9.6 oz (82.4 kg)    Physical Exam Vitals reviewed.  Constitutional:      Appearance: Normal appearance. He is well-developed.  Neck:     Thyroid : No thyroid  mass or thyromegaly.  Cardiovascular:     Rate and Rhythm: Regular rhythm.     Heart sounds: Normal heart sounds.  Pulmonary:     Effort: Pulmonary effort is normal. No respiratory distress.     Breath sounds: Normal breath sounds. No wheezing, rhonchi or rales.  Abdominal:     General: Bowel sounds are normal. There is no distension.     Palpations: Abdomen is soft. Abdomen is not rigid. There is no fluid wave or mass.     Tenderness: There is no abdominal tenderness. There is no guarding or rebound. Negative signs include Murphy's sign and McBurney's sign.  Lymphadenopathy:     Head:     Right side of head: No submental, submandibular, tonsillar, preauricular, posterior auricular or occipital adenopathy.     Left side of head: No submental, submandibular, tonsillar, preauricular, posterior auricular or occipital adenopathy.     Cervical: No cervical adenopathy.  Skin:    General: Skin is warm and dry.         Comments: Scar noted left mid back after tick removed.   Neurological:     Mental Status: He is alert.  Psychiatric:        Speech: Speech normal.        Behavior: Behavior normal.

## 2023-12-21 NOTE — Assessment & Plan Note (Signed)
 Encouraged exercise. STD testing ordered as requested. Declines influenza.

## 2023-12-22 LAB — CBC WITH DIFFERENTIAL/PLATELET
Basophils Absolute: 0 K/uL (ref 0.0–0.1)
Basophils Relative: 0.6 % (ref 0.0–3.0)
Eosinophils Absolute: 0.3 K/uL (ref 0.0–0.7)
Eosinophils Relative: 6.9 % — ABNORMAL HIGH (ref 0.0–5.0)
HCT: 44.1 % (ref 39.0–52.0)
Hemoglobin: 14.6 g/dL (ref 13.0–17.0)
Lymphocytes Relative: 29.2 % (ref 12.0–46.0)
Lymphs Abs: 1.3 K/uL (ref 0.7–4.0)
MCHC: 33.2 g/dL (ref 30.0–36.0)
MCV: 91.1 fl (ref 78.0–100.0)
Monocytes Absolute: 0.4 K/uL (ref 0.1–1.0)
Monocytes Relative: 8.7 % (ref 3.0–12.0)
Neutro Abs: 2.5 K/uL (ref 1.4–7.7)
Neutrophils Relative %: 54.6 % (ref 43.0–77.0)
Platelets: 157 K/uL (ref 150.0–400.0)
RBC: 4.83 Mil/uL (ref 4.22–5.81)
RDW: 13 % (ref 11.5–15.5)
WBC: 4.6 K/uL (ref 4.0–10.5)

## 2023-12-22 LAB — LIPID PANEL
Cholesterol: 130 mg/dL (ref 0–200)
HDL: 55 mg/dL (ref >39.00)
LDL Cholesterol: 61 mg/dL (ref 0–99)
NonHDL: 74.99
Total CHOL/HDL Ratio: 2
Triglycerides: 69 mg/dL (ref 0.0–149.0)
VLDL: 13.8 mg/dL (ref 0.0–40.0)

## 2023-12-22 LAB — COMPREHENSIVE METABOLIC PANEL WITH GFR
ALT: 12 U/L (ref 0–53)
AST: 17 U/L (ref 0–37)
Albumin: 4.8 g/dL (ref 3.5–5.2)
Alkaline Phosphatase: 68 U/L (ref 39–117)
BUN: 22 mg/dL (ref 6–23)
CO2: 29 meq/L (ref 19–32)
Calcium: 9.4 mg/dL (ref 8.4–10.5)
Chloride: 101 meq/L (ref 96–112)
Creatinine, Ser: 1.26 mg/dL (ref 0.40–1.50)
GFR: 74.24 mL/min (ref >60.00)
Glucose, Bld: 80 mg/dL (ref 70–99)
Potassium: 4.2 meq/L (ref 3.5–5.1)
Sodium: 138 meq/L (ref 135–145)
Total Bilirubin: 0.7 mg/dL (ref 0.2–1.2)
Total Protein: 7.9 g/dL (ref 6.0–8.3)

## 2023-12-22 LAB — LYME DISEASE SEROLOGY W/REFLEX: Lyme Total Antibody EIA: NEGATIVE

## 2023-12-24 LAB — GC/CHLAMYDIA PROBE AMP
Chlamydia trachomatis, NAA: NEGATIVE
Neisseria Gonorrhoeae by PCR: NEGATIVE

## 2023-12-25 LAB — URINE CYTOLOGY ANCILLARY ONLY
Chlamydia: NEGATIVE
Comment: NEGATIVE
Comment: NEGATIVE
Comment: NORMAL
Neisseria Gonorrhea: NEGATIVE
Trichomonas: NEGATIVE

## 2023-12-25 LAB — HIV ANTIBODY (ROUTINE TESTING W REFLEX)
HIV 1&2 Ab, 4th Generation: NONREACTIVE
HIV FINAL INTERPRETATION: NEGATIVE

## 2023-12-25 LAB — ACUTE HEP PANEL AND HEP B SURFACE AB
HEPATITIS C ANTIBODY REFILL$(REFL): NONREACTIVE
Hep A IgM: NONREACTIVE
Hep B C IgM: NONREACTIVE
Hepatitis B Surface Ag: NONREACTIVE

## 2023-12-25 LAB — RPR: RPR Ser Ql: NONREACTIVE

## 2023-12-25 LAB — REFLEX TIQ

## 2023-12-26 ENCOUNTER — Ambulatory Visit: Payer: Self-pay | Admitting: Family

## 2024-12-26 ENCOUNTER — Encounter: Admitting: Family
